# Patient Record
Sex: Male | Born: 1970 | Race: White | Hispanic: No | Marital: Single | Smoking: Current every day smoker
Health system: Southern US, Community
[De-identification: ages and names within clinical notes are randomized; demographics above are authoritative.]

## PROBLEM LIST (undated history)

## (undated) DIAGNOSIS — F419 Anxiety disorder, unspecified: Secondary | ICD-10-CM

## (undated) DIAGNOSIS — F32A Depression, unspecified: Secondary | ICD-10-CM

## (undated) DIAGNOSIS — I10 Essential (primary) hypertension: Secondary | ICD-10-CM

## (undated) DIAGNOSIS — F329 Major depressive disorder, single episode, unspecified: Secondary | ICD-10-CM

---

## 2012-05-14 ENCOUNTER — Encounter (HOSPITAL_COMMUNITY): Payer: Self-pay | Admitting: Emergency Medicine

## 2012-05-14 ENCOUNTER — Emergency Department (HOSPITAL_COMMUNITY)
Admission: EM | Admit: 2012-05-14 | Discharge: 2012-05-15 | Disposition: A | Discharge status: Home / Self Care | Payer: Self-pay | Attending: Emergency Medicine | Admitting: Emergency Medicine

## 2012-05-14 DIAGNOSIS — T07XXXA Unspecified multiple injuries, initial encounter: Secondary | ICD-10-CM | POA: Insufficient documentation

## 2012-05-14 DIAGNOSIS — Z23 Encounter for immunization: Secondary | ICD-10-CM | POA: Insufficient documentation

## 2012-05-14 DIAGNOSIS — S0292XA Unspecified fracture of facial bones, initial encounter for closed fracture: Secondary | ICD-10-CM

## 2012-05-14 DIAGNOSIS — S0280XA Fracture of other specified skull and facial bones, unspecified side, initial encounter for closed fracture: Secondary | ICD-10-CM | POA: Insufficient documentation

## 2012-05-14 DIAGNOSIS — F17200 Nicotine dependence, unspecified, uncomplicated: Secondary | ICD-10-CM | POA: Insufficient documentation

## 2012-05-14 HISTORY — DX: Essential (primary) hypertension: I10

## 2012-05-14 MED ORDER — IBUPROFEN 800 MG PO TABS
800.0000 mg | ORAL_TABLET | Freq: Once | ORAL | Status: AC
  Administered 2012-05-15: 800 mg via ORAL
  Filled 2012-05-14: qty 1
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
## 2012-05-15 ENCOUNTER — Emergency Department (HOSPITAL_COMMUNITY): Payer: Self-pay

## 2012-05-15 MED ORDER — IBUPROFEN 800 MG PO TABS: 800.0000 mg | ORAL_TABLET | Freq: Three times a day (TID) | ORAL | Status: DC

## 2012-05-15 MED ORDER — TETANUS-DIPHTH-ACELL PERTUSSIS 5-2.5-18.5 LF-MCG/0.5 IM SUSP
0.5000 mL | Freq: Once | INTRAMUSCULAR | Status: AC
  Administered 2012-05-15: 0.5 mL via INTRAMUSCULAR
  Filled 2012-05-15: qty 0.5

## 2012-05-15 MED ORDER — CEPHALEXIN 500 MG PO CAPS: 500.0000 mg | ORAL_CAPSULE | Freq: Four times a day (QID) | ORAL | Status: DC

## 2012-05-15 MED ORDER — HYDROCODONE-ACETAMINOPHEN 5-325 MG PO TABS: 2.0000 | ORAL_TABLET | ORAL | Status: DC | PRN

## 2012-05-15 NOTE — ED Notes (Signed)
Pt able to ambulate w/o assistance.   

## 2012-06-02 ENCOUNTER — Emergency Department (HOSPITAL_COMMUNITY)
Admission: EM | Admit: 2012-06-02 | Discharge: 2012-06-03 | Disposition: A | Discharge status: Home / Self Care | Payer: Self-pay | Attending: Emergency Medicine | Admitting: Emergency Medicine

## 2012-06-02 ENCOUNTER — Encounter (HOSPITAL_COMMUNITY): Payer: Self-pay | Admitting: *Deleted

## 2012-06-02 DIAGNOSIS — H5789 Other specified disorders of eye and adnexa: Secondary | ICD-10-CM | POA: Insufficient documentation

## 2012-06-02 DIAGNOSIS — I10 Essential (primary) hypertension: Secondary | ICD-10-CM | POA: Insufficient documentation

## 2012-06-02 DIAGNOSIS — Z8781 Personal history of (healed) traumatic fracture: Secondary | ICD-10-CM | POA: Insufficient documentation

## 2012-06-02 DIAGNOSIS — F17200 Nicotine dependence, unspecified, uncomplicated: Secondary | ICD-10-CM | POA: Insufficient documentation

## 2012-06-02 DIAGNOSIS — R220 Localized swelling, mass and lump, head: Secondary | ICD-10-CM | POA: Insufficient documentation

## 2012-06-02 NOTE — ED Notes (Signed)
Called for pt in [facility], no answer

## 2012-06-02 NOTE — ED Notes (Signed)
UXL:[id number]<NU> Expected date:<BR> Expected time:<BR> Means of arrival:<BR> Comments:<BR> Hold: Tr 3

## 2012-06-02 NOTE — ED Notes (Signed)
Unable to locate patient.

## 2012-06-02 NOTE — ED Notes (Signed)
Pt c/o altered vision, bleeding from nose, and blood in cornea which makes his eyesight worse. Pt continues to have ecchymosis and increased swelling to L eye in past few days.
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
## 2012-06-02 NOTE — ED Notes (Signed)
Pt c/o continued eye pain, headaches and was unable to follow up with specialist due to lack of insurance. Pt has taken no medication for pain, including OTC meds.

## 2012-06-02 NOTE — ED Notes (Signed)
Continue to be unable to find patient.
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
## 2012-06-09 NOTE — ED Provider Notes (Signed)
Medical screening examination/treatment/procedure(s) were performed by non-physician practitioner and as supervising physician I was immediately available for consultation/collaboration.  Ananda Caya M Devun Anna, MD 06/09/12 2309 

## 2015-07-20 ENCOUNTER — Emergency Department (HOSPITAL_COMMUNITY)
Admission: EM | Admit: 2015-07-20 | Discharge: 2015-07-21 | Disposition: A | Discharge status: Home / Self Care | Payer: Self-pay | Attending: Emergency Medicine | Admitting: Emergency Medicine

## 2015-07-20 ENCOUNTER — Emergency Department (HOSPITAL_COMMUNITY): Payer: Self-pay

## 2015-07-20 ENCOUNTER — Encounter (HOSPITAL_COMMUNITY): Payer: Self-pay | Admitting: Emergency Medicine

## 2015-07-20 DIAGNOSIS — Y998 Other external cause status: Secondary | ICD-10-CM | POA: Insufficient documentation

## 2015-07-20 DIAGNOSIS — Y9389 Activity, other specified: Secondary | ICD-10-CM | POA: Insufficient documentation

## 2015-07-20 DIAGNOSIS — S01321A Laceration with foreign body of right ear, initial encounter: Secondary | ICD-10-CM | POA: Insufficient documentation

## 2015-07-20 DIAGNOSIS — L988 Other specified disorders of the skin and subcutaneous tissue: Secondary | ICD-10-CM | POA: Insufficient documentation

## 2015-07-20 DIAGNOSIS — F17210 Nicotine dependence, cigarettes, uncomplicated: Secondary | ICD-10-CM | POA: Insufficient documentation

## 2015-07-20 DIAGNOSIS — I10 Essential (primary) hypertension: Secondary | ICD-10-CM | POA: Insufficient documentation

## 2015-07-20 DIAGNOSIS — Z590 Homelessness: Secondary | ICD-10-CM | POA: Insufficient documentation

## 2015-07-20 DIAGNOSIS — S01311A Laceration without foreign body of right ear, initial encounter: Secondary | ICD-10-CM

## 2015-07-20 DIAGNOSIS — Y92410 Unspecified street and highway as the place of occurrence of the external cause: Secondary | ICD-10-CM | POA: Insufficient documentation

## 2015-07-20 MED ORDER — LIDOCAINE-EPINEPHRINE (PF) 2 %-1:200000 IJ SOLN
10.0000 mL | Freq: Once | INTRAMUSCULAR | Status: AC
  Administered 2015-07-20: 10 mL

## 2015-07-20 MED ORDER — CLINDAMYCIN PHOSPHATE 900 MG/50ML IV SOLN
900.0000 mg | Freq: Once | INTRAVENOUS | Status: AC
  Administered 2015-07-20: 900 mg via INTRAVENOUS
  Filled 2015-07-20: qty 50

## 2015-07-20 NOTE — ED Notes (Signed)
ENT at bedside

## 2015-07-20 NOTE — ED Notes (Signed)
MD aware of patient ear.
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
## 2015-07-20 NOTE — ED Notes (Signed)
MD at bedside. 

## 2015-07-21 MED ORDER — CEPHALEXIN 500 MG PO CAPS: 500.0000 mg | ORAL_CAPSULE | Freq: Four times a day (QID) | ORAL | Status: DC

## 2015-07-21 MED ORDER — OXYCODONE-ACETAMINOPHEN 5-325 MG PO TABS: 1.0000 | ORAL_TABLET | ORAL | Status: DC | PRN

## 2015-07-21 NOTE — ED Notes (Signed)
Patient left at this time with all belongings. Provided homeless shelter resources and clean set of clothes
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
## 2016-02-03 ENCOUNTER — Emergency Department (HOSPITAL_COMMUNITY)
Admission: EM | Admit: 2016-02-03 | Discharge: 2016-02-04 | Disposition: A | Discharge status: Home / Self Care | Payer: Self-pay | Attending: Emergency Medicine | Admitting: Emergency Medicine

## 2016-02-03 ENCOUNTER — Encounter (HOSPITAL_COMMUNITY): Payer: Self-pay | Admitting: Emergency Medicine

## 2016-02-03 DIAGNOSIS — Z792 Long term (current) use of antibiotics: Secondary | ICD-10-CM | POA: Insufficient documentation

## 2016-02-03 DIAGNOSIS — L259 Unspecified contact dermatitis, unspecified cause: Secondary | ICD-10-CM | POA: Insufficient documentation

## 2016-02-03 DIAGNOSIS — I10 Essential (primary) hypertension: Secondary | ICD-10-CM | POA: Insufficient documentation

## 2016-02-03 DIAGNOSIS — F17210 Nicotine dependence, cigarettes, uncomplicated: Secondary | ICD-10-CM | POA: Insufficient documentation

## 2016-02-03 DIAGNOSIS — Z791 Long term (current) use of non-steroidal anti-inflammatories (NSAID): Secondary | ICD-10-CM | POA: Insufficient documentation

## 2016-02-03 DIAGNOSIS — R21 Rash and other nonspecific skin eruption: Secondary | ICD-10-CM

## 2016-02-03 MED ORDER — PENICILLIN G BENZATHINE 1200000 UNIT/2ML IM SUSP
2.4000 10*6.[IU] | Freq: Once | INTRAMUSCULAR | Status: AC
  Administered 2016-02-04: 2.4 10*6.[IU] via INTRAMUSCULAR
  Filled 2016-02-03 (×2): qty 4

## 2016-02-03 MED ORDER — AZITHROMYCIN 250 MG PO TABS
1000.0000 mg | ORAL_TABLET | Freq: Once | ORAL | Status: AC
  Administered 2016-02-04: 1000 mg via ORAL
  Filled 2016-02-03: qty 4

## 2016-02-03 MED ORDER — STERILE WATER FOR INJECTION IJ SOLN
INTRAMUSCULAR | Status: AC
  Administered 2016-02-04: 0.9 mL
  Filled 2016-02-03: qty 10

## 2016-02-03 MED ORDER — CEFTRIAXONE SODIUM 250 MG IJ SOLR
250.0000 mg | Freq: Once | INTRAMUSCULAR | Status: AC
  Administered 2016-02-04: 250 mg via INTRAMUSCULAR
  Filled 2016-02-03: qty 250

## 2016-02-03 MED ORDER — PENICILLIN G BENZATHINE & PROC 1200000 UNIT/2ML IM SUSP: 2.4000 10*6.[IU] | Freq: Once | INTRAMUSCULAR | Status: DC

## 2016-02-03 NOTE — ED Notes (Signed)
Bed: [facility] Expected date:  Expected time:  Means of arrival:  Comments: 
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
## 2016-02-04 LAB — RPR: RPR: NONREACTIVE

## 2016-02-04 LAB — HIV ANTIBODY (ROUTINE TESTING W REFLEX): HIV Screen 4th Generation wRfx: NONREACTIVE

## 2016-02-04 LAB — GC/CHLAMYDIA PROBE AMP (~~LOC~~) NOT AT ARMC
CHLAMYDIA, DNA PROBE: NEGATIVE
Neisseria Gonorrhea: NEGATIVE

## 2016-02-04 MED ORDER — DESONIDE 0.05 % EX LOTN: TOPICAL_LOTION | Freq: Two times a day (BID) | CUTANEOUS | 0 refills | Status: DC

## 2016-02-04 NOTE — ED Provider Notes (Signed)
[facility]-EMERGENCY DEPT Provider Note   CSN: [id number] Arrival date & time: 02/03/16  2327     History   Chief Complaint Chief Complaint  Patient presents with  . Rash    HPI Ralph Anderson is a 45 y.o. male.  45 yo M with rash to groin.  Going on for at least two months.  He thinks its related to heat rash. Some mild itching and erythema, has been using talcum powder and steroid cream with some relief.  Denies fevers, chills, plant exposure.    The history is provided by the patient.  Rash   This is a new problem. The current episode started more than 1 week ago. The problem has been gradually worsening. The problem is associated with nothing. There has been no fever. The fever has been present for less than 1 day. The pain is at a severity of 2/10. The pain is mild. The pain has been constant since onset. He has tried steriods for the symptoms. The treatment provided mild relief.    Past Medical History:  Diagnosis Date  . Hypertension     There are no active problems to display for this patient.   History reviewed. No pertinent surgical history.     Home Medications    Prior to Admission medications   Medication Sig Start Date End Date Taking? Authorizing Provider  cephALEXin (KEFLEX) 500 MG capsule Take 1 capsule (500 mg total) by mouth 4 (four) times daily. 07/21/15   Dione Boozeavid Glick, MD  desonide (DESOWEN) 0.05 % lotion Apply topically 2 (two) times daily. 02/04/16   Melene Planan Korra Christine, DO  oxyCODONE-acetaminophen (PERCOCET) 5-325 MG tablet Take 1 tablet by mouth every 4 (four) hours as needed for moderate pain. 07/21/15   Dione Boozeavid Glick, MD    Family History No family history on file.  Social History Social History  Substance Use Topics  . Smoking status: Current Every Day Smoker    Packs/day: 1.00    Types: Cigarettes  . Smokeless tobacco: Never Used  . Alcohol use 25.2 oz/week    42 Cans of beer per week     Allergies   Review of patient's allergies indicates no known  allergies.   Review of Systems Review of Systems  Constitutional: Negative for chills and fever.  HENT: Negative for congestion and facial swelling.   Eyes: Negative for discharge and visual disturbance.  Respiratory: Negative for shortness of breath.   Cardiovascular: Negative for chest pain and palpitations.  Gastrointestinal: Negative for abdominal pain, diarrhea and vomiting.  Musculoskeletal: Negative for arthralgias and myalgias.  Skin: Positive for color change and rash.  Neurological: Negative for tremors, syncope and headaches.  Psychiatric/Behavioral: Negative for confusion and dysphoric mood.     Physical Exam Updated Vital Signs BP 126/78 (BP Location: Left Wrist)   Pulse 92   Temp 97.9 F (36.6 C) (Oral)   SpO2 92%   Physical Exam  Constitutional: He is oriented to person, place, and time. He appears well-developed and well-nourished.  HENT:  Head: Normocephalic and atraumatic.  Eyes: EOM are normal. Pupils are equal, round, and reactive to light.  Neck: Normal range of motion. Neck supple. No JVD present.  Cardiovascular: Normal rate and regular rhythm.  Exam reveals no gallop and no friction rub.   No murmur heard. Pulmonary/Chest: No respiratory distress. He has no wheezes.  Abdominal: He exhibits no distension. There is no rebound and no guarding.    Genitourinary:     Musculoskeletal: Normal range of motion.  Neurological: He is alert and oriented to person, place, and time.  Skin: No rash noted. No pallor.  Psychiatric: He has a normal mood and affect. His behavior is normal.  Nursing note and vitals reviewed.    ED Treatments / Results  Labs (all labs ordered are listed, but only abnormal results are displayed) Labs Reviewed  RPR  HIV ANTIBODY (ROUTINE TESTING)  GC/CHLAMYDIA PROBE AMP ([place]) NOT AT [facility]    EKG  EKG Interpretation None       Radiology No results found.  Procedures Procedures (including critical care  time)  Medications Ordered in ED Medications  cefTRIAXone (ROCEPHIN) injection 250 mg (250 mg Intramuscular Given 02/04/16 0001)  azithromycin (ZITHROMAX) tablet 1,000 mg (1,000 mg Oral Given 02/04/16 0001)  penicillin g benzathine (BICILLIN LA) 1200000 UNIT/2ML injection 2.4 Million Units (2.4 Million Units Intramuscular Given 02/04/16 0001)  sterile water (preservative free) injection (0.9 mLs  Given 02/04/16 0010)     Initial Impression / Assessment and Plan / ED Course  I have reviewed the triage vital signs and the nursing notes.  Pertinent labs & imaging results that were available during my care of the patient were reviewed by me and considered in my medical decision making (see chart for details).  Clinical Course    45 yo M with A rash to the groin. This extends onto his penis. Is been going on for the past 2 months. I will treat presumptively for STDs and syphilis. Patient's largest area appears to coincide with where his belt buckle since. This may be a contact dermatitis from nickel. Suggested that he try and replace the belt buckle. Given prescription for steroid cream.  12:31 AM:  I have discussed the diagnosis/risks/treatment options with the patient and believe the pt to be eligible for discharge home to follow-up with PCP. We also discussed returning to the ED immediately if new or worsening sx occur. We discussed the sx which are most concerning (e.g., sudden worsening pain, fever, inability to tolerate by mouth) that necessitate immediate return. Medications administered to the patient during their visit and any new prescriptions provided to the patient are listed below.  Medications given during this visit Medications  cefTRIAXone (ROCEPHIN) injection 250 mg (250 mg Intramuscular Given 02/04/16 0001)  azithromycin (ZITHROMAX) tablet 1,000 mg (1,000 mg Oral Given 02/04/16 0001)  penicillin g benzathine (BICILLIN LA) 1200000 UNIT/2ML injection 2.4 Million Units (2.4 Million  Units Intramuscular Given 02/04/16 0001)  sterile water (preservative free) injection (0.9 mLs  Given 02/04/16 0010)     The patient appears reasonably screen and/or stabilized for discharge and I doubt any other medical condition or other [facility] requiring further screening, evaluation, or treatment in the ED at this time prior to discharge.    Final Clinical Impressions(s) / ED Diagnoses   Final diagnoses:  Contact dermatitis  Rash and nonspecific skin eruption    New Prescriptions New Prescriptions   DESONIDE (DESOWEN) 0.05 % LOTION    Apply topically 2 (two) times daily.     Melene Plan, DO 02/04/16 0031

## 2016-04-07 ENCOUNTER — Emergency Department (HOSPITAL_COMMUNITY)
Admission: EM | Admit: 2016-04-07 | Discharge: 2016-04-08 | Disposition: A | Discharge status: Home / Self Care | Payer: Federal, State, Local not specified - Other | Attending: Emergency Medicine | Admitting: Emergency Medicine

## 2016-04-07 DIAGNOSIS — F17210 Nicotine dependence, cigarettes, uncomplicated: Secondary | ICD-10-CM | POA: Insufficient documentation

## 2016-04-07 DIAGNOSIS — Z79899 Other long term (current) drug therapy: Secondary | ICD-10-CM | POA: Insufficient documentation

## 2016-04-07 DIAGNOSIS — I10 Essential (primary) hypertension: Secondary | ICD-10-CM | POA: Insufficient documentation

## 2016-04-07 DIAGNOSIS — F32A Depression, unspecified: Secondary | ICD-10-CM

## 2016-04-07 DIAGNOSIS — F1014 Alcohol abuse with alcohol-induced mood disorder: Secondary | ICD-10-CM | POA: Insufficient documentation

## 2016-04-07 DIAGNOSIS — F329 Major depressive disorder, single episode, unspecified: Secondary | ICD-10-CM | POA: Insufficient documentation

## 2016-04-07 DIAGNOSIS — R45851 Suicidal ideations: Secondary | ICD-10-CM

## 2016-04-08 ENCOUNTER — Encounter (HOSPITAL_COMMUNITY): Payer: Self-pay

## 2016-04-08 DIAGNOSIS — F17210 Nicotine dependence, cigarettes, uncomplicated: Secondary | ICD-10-CM | POA: Diagnosis not present

## 2016-04-08 DIAGNOSIS — F1014 Alcohol abuse with alcohol-induced mood disorder: Secondary | ICD-10-CM | POA: Diagnosis present

## 2016-04-08 DIAGNOSIS — Z79899 Other long term (current) drug therapy: Secondary | ICD-10-CM

## 2016-04-08 DIAGNOSIS — Z79891 Long term (current) use of opiate analgesic: Secondary | ICD-10-CM

## 2016-04-08 LAB — COMPREHENSIVE METABOLIC PANEL
ALT: 25 U/L (ref 17–63)
ANION GAP: 7 (ref 5–15)
AST: 31 U/L (ref 15–41)
Albumin: 3.8 g/dL (ref 3.5–5.0)
Alkaline Phosphatase: 80 U/L (ref 38–126)
BUN: 15 mg/dL (ref 6–20)
CHLORIDE: 106 mmol/L (ref 101–111)
CO2: 25 mmol/L (ref 22–32)
Calcium: 8.8 mg/dL — ABNORMAL LOW (ref 8.9–10.3)
Creatinine, Ser: 0.68 mg/dL (ref 0.61–1.24)
Glucose, Bld: 108 mg/dL — ABNORMAL HIGH (ref 65–99)
POTASSIUM: 4.5 mmol/L (ref 3.5–5.1)
SODIUM: 138 mmol/L (ref 135–145)
Total Bilirubin: 1.4 mg/dL — ABNORMAL HIGH (ref 0.3–1.2)
Total Protein: 6 g/dL — ABNORMAL LOW (ref 6.5–8.1)

## 2016-04-08 LAB — RAPID URINE DRUG SCREEN, HOSP PERFORMED
AMPHETAMINES: NOT DETECTED
Barbiturates: NOT DETECTED
Benzodiazepines: NOT DETECTED
Cocaine: NOT DETECTED
OPIATES: NOT DETECTED
Tetrahydrocannabinol: NOT DETECTED

## 2016-04-08 LAB — CBC
HCT: 42.3 % (ref 39.0–52.0)
Hemoglobin: 14.7 g/dL (ref 13.0–17.0)
MCH: 30.3 pg (ref 26.0–34.0)
MCHC: 34.8 g/dL (ref 30.0–36.0)
MCV: 87.2 fL (ref 78.0–100.0)
Platelets: 184 10*3/uL (ref 150–400)
RBC: 4.85 MIL/uL (ref 4.22–5.81)
RDW: 13.8 % (ref 11.5–15.5)
WBC: 8.5 10*3/uL (ref 4.0–10.5)

## 2016-04-08 LAB — ACETAMINOPHEN LEVEL

## 2016-04-08 LAB — ETHANOL

## 2016-04-08 LAB — SALICYLATE LEVEL

## 2016-04-08 NOTE — ED Notes (Signed)
Pt belongings placed in locker 29 

## 2016-04-08 NOTE — Progress Notes (Signed)
CSW spoke with patient at bedside. CSW informed patient that psychiatrist recommends that patient follow up with [facility] to receive outpatient treatment. CSW provided patient with [facility] handout that included services provided and contact information. CSW encouraged patient to follow up with [facility]. CSW inquired if patient had any questions, patient replied no.

## 2016-04-08 NOTE — ED Notes (Signed)
Bed: WA32 Expected date:  Expected time:  Means of arrival:  Comments: 

## 2016-04-08 NOTE — ED Notes (Addendum)
Pt is refuses blood work, Charity fundraiserN made aware

## 2016-04-08 NOTE — ED Notes (Signed)
Pt refusing labs states unbearable fear of needles

## 2016-04-08 NOTE — ED Notes (Signed)
Pt states that he is willing to have labwork drawn now, notified phlebotomist to draw when available.

## 2016-04-08 NOTE — Consult Note (Signed)
[facility] Consult   Reason for Consult:  Alcohol abuse with suicidal ideations Referring Physician:  EDP Patient Identification: Ralph Anderson MRN:  [id number] Principal Diagnosis: Alcohol abuse with alcohol-induced mood disorder [facility]) Diagnosis:   Patient Active Problem List   Diagnosis Date Noted  . Alcohol abuse with alcohol-induced mood disorder ([place]) [F10.14] 04/08/2016    Priority: High    Total Time spent with patient: 45 minutes  Subjective:   Ralph Anderson is a 45 y.o. male patient does not warrant admission.  HPI:  45 yo male who presented to the ED with alcohol abuse and suicidal ideations.  He is sober today with clear and coherent thought process.  Denies suicidal/homicidal ideations along with past attempts.  No hallucinations or withdrawal symptoms.  Minimizes his alcohol use but has a DWI court date in December.  Stable for discharge.  Past Psychiatric History: none  Risk to Self: Suicidal Ideation: Yes-Currently Present Suicidal Intent: Yes-Currently Present Is patient at risk for suicide?: Yes Suicidal Plan?: No Access to Means: No What has been your use of drugs/alcohol within the last 12 months?: na How many times?: 0 Other Self Harm Risks: 0 Triggers for Past Attempts: Unknown Intentional Self Injurious Behavior: None Risk to Others: Homicidal Ideation: No Thoughts of Harm to Others: No Current Homicidal Intent: No Current Homicidal Plan: No Access to Homicidal Means: No Identified Victim: na History of harm to others?: No Assessment of Violence: None Noted Violent Behavior Description: na Does patient have access to weapons?: No Criminal Charges Pending?: Yes Describe Pending Criminal Charges: 2 DWI Does patient have a court date: Yes Court Date: 04/28/16 Prior Inpatient Therapy: Prior Inpatient Therapy: No Prior Therapy Dates: na Prior Therapy Facilty/Provider(s): na Reason for Treatment: na Prior Outpatient Therapy: Prior  Outpatient Therapy: No Prior Therapy Facilty/Provider(s): na Reason for Treatment: na Does patient have an ACCT team?: No Does patient have Intensive In-House Services?  : No Does patient have [facility] services? : No Does patient have P4[facility] services?: No  Past Medical History:  Past Medical History:  Diagnosis Date  . Hypertension    No past surgical history on file. Family History: No family history on file. Family Psychiatric  History: none Social History:  History  Alcohol Use  . 25.2 oz/week  . 43 Cans of beer per week     History  Drug Use No    Social History   Social History  . Marital status: Single    Spouse name: N/A  . Number of children: N/A  . Years of education: N/A   Social History Main Topics  . Smoking status: Current Every Day Smoker    Packs/day: 1.00    Types: Cigarettes  . Smokeless tobacco: Never Used  . Alcohol use 25.2 oz/week    42 Cans of beer per week  . Drug use: No  . Sexual activity: Yes   Other Topics Concern  . None   Social History Narrative  . None   Additional Social History:    Allergies:  No Known Allergies  Labs:  Results for orders placed or performed during the hospital encounter of 04/07/16 (from the past 48 hour(s))  Rapid urine drug screen (hospital performed)     Status: None   Collection Time: 04/08/16 12:15 AM  Result Value Ref Range   Opiates NONE DETECTED NONE DETECTED   Cocaine NONE DETECTED NONE DETECTED   Benzodiazepines NONE DETECTED NONE DETECTED   Amphetamines NONE DETECTED NONE DETECTED   Tetrahydrocannabinol  NONE DETECTED NONE DETECTED   Barbiturates NONE DETECTED NONE DETECTED    Comment:        DRUG SCREEN FOR MEDICAL PURPOSES ONLY.  IF CONFIRMATION IS NEEDED FOR ANY PURPOSE, NOTIFY LAB WITHIN 5 DAYS.        LOWEST DETECTABLE LIMITS FOR URINE DRUG SCREEN Drug Class       Cutoff (ng/mL) Amphetamine      1000 Barbiturate      200 Benzodiazepine   812 Tricyclics       751 Opiates           300 Cocaine          300 THC              50   Comprehensive metabolic panel     Status: Abnormal   Collection Time: 04/08/16  8:10 AM  Result Value Ref Range   Sodium 138 135 - 145 mmol/L   Potassium 4.5 3.5 - 5.1 mmol/L   Chloride 106 101 - 111 mmol/L   CO2 25 22 - 32 mmol/L   Glucose, Bld 108 (H) 65 - 99 mg/dL   BUN 15 6 - 20 mg/dL   Creatinine, Ser 0.68 0.61 - 1.24 mg/dL   Calcium 8.8 (L) 8.9 - 10.3 mg/dL   Total Protein 6.0 (L) 6.5 - 8.1 g/dL   Albumin 3.8 3.5 - 5.0 g/dL   AST 31 15 - 41 U/L   ALT 25 17 - 63 U/L   Alkaline Phosphatase 80 38 - 126 U/L   Total Bilirubin 1.4 (H) 0.3 - 1.2 mg/dL   GFR calc non Af Amer >60 >60 mL/min   GFR calc Af Amer >60 >60 mL/min    Comment: (NOTE) The eGFR has been calculated using the CKD EPI equation. This calculation has not been validated in all clinical situations. eGFR's persistently <60 mL/min signify possible Chronic Kidney Disease.    Anion gap 7 5 - 15  Ethanol     Status: None   Collection Time: 04/08/16  8:10 AM  Result Value Ref Range   Alcohol, Ethyl (B) <5 <5 mg/dL    Comment:        LOWEST DETECTABLE LIMIT FOR SERUM ALCOHOL IS 5 mg/dL FOR MEDICAL PURPOSES ONLY   Salicylate level     Status: None   Collection Time: 04/08/16  8:10 AM  Result Value Ref Range   Salicylate Lvl <7.0 2.8 - 30.0 mg/dL  Acetaminophen level     Status: Abnormal   Collection Time: 04/08/16  8:10 AM  Result Value Ref Range   Acetaminophen (Tylenol), Serum <10 (L) 10 - 30 ug/mL    Comment:        THERAPEUTIC CONCENTRATIONS VARY SIGNIFICANTLY. A RANGE OF 10-30 ug/mL MAY BE AN EFFECTIVE CONCENTRATION FOR MANY PATIENTS. HOWEVER, SOME ARE BEST TREATED AT CONCENTRATIONS OUTSIDE THIS RANGE. ACETAMINOPHEN CONCENTRATIONS >150 ug/mL AT 4 HOURS AFTER INGESTION AND >50 ug/mL AT 12 HOURS AFTER INGESTION ARE OFTEN ASSOCIATED WITH TOXIC REACTIONS.   cbc     Status: None   Collection Time: 04/08/16  8:10 AM  Result Value Ref Range   WBC 8.5  4.0 - 10.5 K/uL   RBC 4.85 4.22 - 5.81 MIL/uL   Hemoglobin 14.7 13.0 - 17.0 g/dL   HCT 42.3 39.0 - 52.0 %   MCV 87.2 78.0 - 100.0 fL   MCH 30.3 26.0 - 34.0 pg   MCHC 34.8 30.0 - 36.0 g/dL   RDW 13.8 11.5 -  15.5 %   Platelets 184 150 - 400 K/uL    No current facility-administered medications for this encounter.    Current Outpatient Prescriptions  Medication Sig Dispense Refill  . cephALEXin (KEFLEX) 500 MG capsule Take 1 capsule (500 mg total) by mouth 4 (four) times daily. (Patient not taking: Reported on 04/08/2016) 40 capsule 0  . desonide (DESOWEN) 0.05 % lotion Apply topically 2 (two) times daily. (Patient not taking: Reported on 04/08/2016) 59 mL 0  . oxyCODONE-acetaminophen (PERCOCET) 5-325 MG tablet Take 1 tablet by mouth every 4 (four) hours as needed for moderate pain. (Patient not taking: Reported on 04/08/2016) 15 tablet 0    Musculoskeletal: Strength & Muscle Tone: within normal limits Gait & Station: normal Patient leans: N/A  Psychiatric Specialty Exam: Physical Exam  Constitutional: He is oriented to person, place, and time. He appears well-developed and well-nourished.  HENT:  Head: Normocephalic.  Neck: Normal range of motion.  Respiratory: Effort normal.  Musculoskeletal: Normal range of motion.  Neurological: He is alert and oriented to person, place, and time.  Psychiatric: His speech is normal and behavior is normal. Judgment and thought content normal. Cognition and memory are normal. He exhibits a depressed mood.    Review of Systems  Constitutional: Negative.   HENT: Negative.   Eyes: Negative.   Respiratory: Negative.   Cardiovascular: Negative.   Gastrointestinal: Negative.   Genitourinary: Negative.   Musculoskeletal: Negative.   Skin: Negative.   Neurological: Negative.   Endo/Heme/Allergies: Negative.   Psychiatric/Behavioral: Positive for depression and substance abuse.    Blood pressure 133/83, pulse 94, temperature 98.1 F (36.7 C),  temperature source Oral, resp. rate 20, height 5' 7"  (1.702 m), weight 81.6 kg (180 lb), SpO2 95 %.Body mass index is 28.19 kg/m.  General Appearance: Casual  Eye Contact:  Good  Speech:  Normal Rate  Volume:  Normal  Mood:  Depressed, mild  Affect:  Congruent  Thought Process:  Coherent and Descriptions of Associations: Intact  Orientation:  Full (Time, Place, and Person)  Thought Content:  WDL  Suicidal Thoughts:  No  Homicidal Thoughts:  No  Memory:  Immediate;   Good Recent;   Good Remote;   Good  Judgement:  Fair  Insight:  Fair  Psychomotor Activity:  Normal  Concentration:  Concentration: Good and Attention Span: Good  Recall:  Good  Fund of Knowledge:  Good  Language:  Good  Akathisia:  No  Handed:  Right  AIMS (if indicated):     Assets:  Housing Intimacy Leisure Time Physical Health Resilience Social Support  ADL's:  Intact  Cognition:  WNL  Sleep:        Treatment Plan Summary: Daily contact with patient to assess and evaluate symptoms and progress in treatment, Medication management and Plan alcohol abuse with alcohol induced mood disorder:  -Crisis stabilization -Medication management:  No medications started as he is discharging -Individual and substance abuse counseling -Outpatient resources provided  Disposition: No evidence of imminent risk to self or others at present.    Waylan Boga, NP 04/08/2016 11:03 AM   Patient seen face to face for this evaluation, case discussed with treatment team and physician extender and formulated treatment plan. Reviewed the information documented and agree with the treatment plan.  Sterlin Knightly 04/09/2016 10:15 AM

## 2016-04-08 NOTE — [facility] Suicide Risk Assessment (Signed)
Suicide Risk Assessment  Discharge Assessment   [facility]   Principal Problem: Alcohol abuse with alcohol-induced mood disorder [facility]) Discharge Diagnoses:  Patient Active Problem List   Diagnosis Date Noted  . Alcohol abuse with alcohol-induced mood disorder ([facility]) [F10.14] 04/08/2016    Priority: High    Total Time spent with patient: 45 minutes  Musculoskeletal: Strength & Muscle Tone: within normal limits Gait & Station: normal Patient leans: N/A  Psychiatric Specialty Exam: Physical Exam  Constitutional: He is oriented to person, place, and time. He appears well-developed and well-nourished.  HENT:  Head: Normocephalic.  Neck: Normal range of motion.  Respiratory: Effort normal.  Musculoskeletal: Normal range of motion.  Neurological: He is alert and oriented to person, place, and time.  Psychiatric: His speech is normal and behavior is normal. Judgment and thought content normal. Cognition and memory are normal. He exhibits a depressed mood.    Review of Systems  Constitutional: Negative.   HENT: Negative.   Eyes: Negative.   Respiratory: Negative.   Cardiovascular: Negative.   Gastrointestinal: Negative.   Genitourinary: Negative.   Musculoskeletal: Negative.   Skin: Negative.   Neurological: Negative.   Endo/Heme/Allergies: Negative.   Psychiatric/Behavioral: Positive for depression and substance abuse.    Blood pressure 133/83, pulse 94, temperature 98.1 F (36.7 C), temperature source Oral, resp. rate 20, height 5\' 7"  (1.702 m), weight 81.6 kg (180 lb), SpO2 95 %.Body mass index is 28.19 kg/m.  General Appearance: Casual  Eye Contact:  Good  Speech:  Normal Rate  Volume:  Normal  Mood:  Depressed, mild  Affect:  Congruent  Thought Process:  Coherent and Descriptions of Associations: Intact  Orientation:  Full (Time, Place, and Person)  Thought Content:  WDL  Suicidal Thoughts:  No  Homicidal Thoughts:  No  Memory:   Immediate;   Good Recent;   Good Remote;   Good  Judgement:  Fair  Insight:  Fair  Psychomotor Activity:  Normal  Concentration:  Concentration: Good and Attention Span: Good  Recall:  Good  Fund of Knowledge:  Good  Language:  Good  Akathisia:  No  Handed:  Right  AIMS (if indicated):     Assets:  Housing Intimacy Leisure Time Physical Health Resilience Social Support  ADL's:  Intact  Cognition:  WNL  Sleep:      Mental Status Per Nursing Assessment::   On Admission:   alcohol abuse with suicidal ideations  Demographic Factors:  Male and Caucasian  Loss Factors: Legal issues  Historical Factors: NA  Risk Reduction Factors:   Sense of responsibility to family, Living with another person, especially a relative and Positive social support  Continued Clinical Symptoms:  Depression,mild  Cognitive Features That Contribute To Risk:  None    Suicide Risk:  Minimal: No identifiable suicidal ideation.  Patients presenting with no risk factors but with morbid ruminations; may be classified as minimal risk based on the severity of the depressive symptoms    Plan Of Care/Follow-up recommendations:  Activity:  as tolerated Diet:  heart healthy diet  LORD, JAMISON, NP 04/08/2016, 11:15 AM

## 2016-04-08 NOTE — ED Triage Notes (Addendum)
Pt states" I am suicidal, and have been thinking about jumping in front of a car". Pt reports that he has been having this feeling for the past 7 days but has worsened today, especially with the holiday. Pt denies HI. Pt states that he has not felt this way before and" wants to talk with someone to get his head straight". Pt brought his self into the ED. Pt reports that he has not tried to harm himself. Denies illicit drug use. Pt reports that he has had (1)  24oz beer today and has not drunk alcohol in approx. 7 days.

## 2016-04-08 NOTE — ED Notes (Signed)
Pt spoke with his Nephew Evert KohlJason Kathan Cell phone # [phone]
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
## 2016-04-08 NOTE — ED Notes (Signed)
Provided patient with toiletries, towels and wash clothes for shower, patient took shower and changed into clean paper scrubs

## 2016-04-08 NOTE — ED Notes (Signed)
Patient is resting comfortably. 

## 2016-04-08 NOTE — ED Provider Notes (Signed)
[facility]-EMERGENCY DEPT Provider Note   CSN: [id number] Arrival date & time: 04/07/16  2315     History   Chief Complaint Chief Complaint  Patient presents with  . Suicidal    HPI Ralph Anderson is a 45 y.o. male.  Patient presents to the emergency department for evaluation of suicidal thoughts. He has recently become homeless and today had an argument with his girlfriend and felt more depressed and reports it was so bad he thought seriously of harming himself. No substance abuse issues. No self harm prior to arrival. No HI/AVH.    The history is provided by the patient. No language interpreter was used.    Past Medical History:  Diagnosis Date  . Hypertension     There are no active problems to display for this patient.   No past surgical history on file.     Home Medications    Prior to Admission medications   Medication Sig Start Date End Date Taking? Authorizing Provider  cephALEXin (KEFLEX) 500 MG capsule Take 1 capsule (500 mg total) by mouth 4 (four) times daily. Patient not taking: Reported on 04/08/2016 07/21/15   Dione Boozeavid Glick, MD  desonide (DESOWEN) 0.05 % lotion Apply topically 2 (two) times daily. Patient not taking: Reported on 04/08/2016 02/04/16   Melene Planan Floyd, DO  oxyCODONE-acetaminophen (PERCOCET) 5-325 MG tablet Take 1 tablet by mouth every 4 (four) hours as needed for moderate pain. Patient not taking: Reported on 04/08/2016 07/21/15   Dione Boozeavid Glick, MD    Family History No family history on file.  Social History Social History  Substance Use Topics  . Smoking status: Current Every Day Smoker    Packs/day: 1.00    Types: Cigarettes  . Smokeless tobacco: Never Used  . Alcohol use 25.2 oz/week    42 Cans of beer per week     Allergies   Patient has no known allergies.   Review of Systems Review of Systems  Constitutional: Negative for chills and fever.  HENT: Negative.   Respiratory: Negative.   Cardiovascular: Negative.   Gastrointestinal:  Negative.   Musculoskeletal: Negative.   Skin: Negative.   Neurological: Negative.   Psychiatric/Behavioral: Positive for dysphoric mood and suicidal ideas. Negative for self-injury.     Physical Exam Updated Vital Signs BP 124/81 (BP Location: Left Arm)   Pulse 87   Temp 97.6 F (36.4 C) (Oral)   Resp 20   Ht 5\' 7"  (1.702 m)   Wt 81.6 kg   SpO2 100%   BMI 28.19 kg/m   Physical Exam  Constitutional: He is oriented to person, place, and time. He appears well-developed and well-nourished. No distress.  HENT:  Head: Normocephalic.  Neck: Normal range of motion. Neck supple.  Cardiovascular: Normal rate and regular rhythm.   Pulmonary/Chest: Effort normal and breath sounds normal. He has no wheezes. He has no rales.  Abdominal: Soft. Bowel sounds are normal. There is no tenderness. There is no rebound and no guarding.  Musculoskeletal: Normal range of motion.  Neurological: He is alert and oriented to person, place, and time.  Skin: Skin is warm and dry. No rash noted.  Psychiatric: He has a normal mood and affect.     ED Treatments / Results  Labs (all labs ordered are listed, but only abnormal results are displayed) Labs Reviewed  RAPID URINE DRUG SCREEN, HOSP PERFORMED  COMPREHENSIVE METABOLIC PANEL  ETHANOL  SALICYLATE LEVEL  ACETAMINOPHEN LEVEL  CBC   Results for orders placed or performed during  the hospital encounter of 04/07/16  Rapid urine drug screen (hospital performed)  Result Value Ref Range   Opiates NONE DETECTED NONE DETECTED   Cocaine NONE DETECTED NONE DETECTED   Benzodiazepines NONE DETECTED NONE DETECTED   Amphetamines NONE DETECTED NONE DETECTED   Tetrahydrocannabinol NONE DETECTED NONE DETECTED   Barbiturates NONE DETECTED NONE DETECTED    EKG  EKG Interpretation  Date/Time:  Saturday April 08 2016 00:47:09 EST Ventricular Rate:  83 PR Interval:  120 QRS Duration: 114 QT Interval:  386 QTC Calculation: 453 R Axis:   -15 Text  Interpretation:  Normal sinus rhythm Incomplete right bundle branch block Left ventricular hypertrophy Abnormal ECG No old tracing to compare Confirmed by [facility] HospitalGLICK  MD, DAVID ([id number]) on 04/08/2016 12:59:21 AM       Radiology No results found.  Procedures Procedures (including critical care time)  Medications Ordered in ED Medications - No data to display   Initial Impression / Assessment and Plan / ED Course  I have reviewed the triage vital signs and the nursing notes.  Pertinent labs & imaging results that were available during my care of the patient were reviewed by me and considered in my medical decision making (see chart for details).  Clinical Course     Patient with depression and SI. Will have TTS consultation to determine disposition.   Final Clinical Impressions(s) / ED Diagnoses   Final diagnoses:  None   1. SI 2. Depression  New Prescriptions New Prescriptions   No medications on file     Danne HarborShari Arshdeep Bolger, PA-C 04/08/16 0236    Dione Boozeavid Glick, MD 04/08/16 [phone]

## 2016-04-24 ENCOUNTER — Encounter (HOSPITAL_COMMUNITY): Payer: Self-pay | Admitting: *Deleted

## 2016-04-24 ENCOUNTER — Emergency Department (HOSPITAL_COMMUNITY)
Admission: EM | Admit: 2016-04-24 | Discharge: 2016-04-25 | Disposition: A | Discharge status: Other Acute Inpatient Hospital | Payer: Federal, State, Local not specified - Other

## 2016-04-24 DIAGNOSIS — F1014 Alcohol abuse with alcohol-induced mood disorder: Secondary | ICD-10-CM | POA: Insufficient documentation

## 2016-04-24 DIAGNOSIS — F332 Major depressive disorder, recurrent severe without psychotic features: Secondary | ICD-10-CM | POA: Diagnosis present

## 2016-04-24 DIAGNOSIS — F1094 Alcohol use, unspecified with alcohol-induced mood disorder: Secondary | ICD-10-CM

## 2016-04-24 DIAGNOSIS — I10 Essential (primary) hypertension: Secondary | ICD-10-CM | POA: Insufficient documentation

## 2016-04-24 DIAGNOSIS — Z5181 Encounter for therapeutic drug level monitoring: Secondary | ICD-10-CM | POA: Insufficient documentation

## 2016-04-24 DIAGNOSIS — F17210 Nicotine dependence, cigarettes, uncomplicated: Secondary | ICD-10-CM | POA: Insufficient documentation

## 2016-04-24 LAB — COMPREHENSIVE METABOLIC PANEL
ALK PHOS: 86 U/L (ref 38–126)
ALT: 23 U/L (ref 17–63)
ANION GAP: 7 (ref 5–15)
AST: 26 U/L (ref 15–41)
Albumin: 4.7 g/dL (ref 3.5–5.0)
BILIRUBIN TOTAL: 1.1 mg/dL (ref 0.3–1.2)
BUN: 11 mg/dL (ref 6–20)
CO2: 27 mmol/L (ref 22–32)
Calcium: 9.1 mg/dL (ref 8.9–10.3)
Chloride: 106 mmol/L (ref 101–111)
Creatinine, Ser: 0.67 mg/dL (ref 0.61–1.24)
GFR calc Af Amer: >60 mL/min (ref >60)
Glucose, Bld: 108 mg/dL — ABNORMAL HIGH (ref 65–99)
POTASSIUM: 4 mmol/L (ref 3.5–5.1)
Sodium: 140 mmol/L (ref 135–145)
Total Protein: 7.5 g/dL (ref 6.5–8.1)

## 2016-04-24 LAB — CBC WITH DIFFERENTIAL/PLATELET
Basophils Absolute: 0 10*3/uL (ref 0.0–0.1)
Basophils Relative: 0 %
Eosinophils Absolute: 0.2 10*3/uL (ref 0.0–0.7)
Eosinophils Relative: 2 %
HEMATOCRIT: 45.8 % (ref 39.0–52.0)
Hemoglobin: 15.8 g/dL (ref 13.0–17.0)
LYMPHS PCT: 37 %
Lymphs Abs: 3.3 10*3/uL (ref 0.7–4.0)
MCH: 29.8 pg (ref 26.0–34.0)
MCHC: 34.5 g/dL (ref 30.0–36.0)
MCV: 86.4 fL (ref 78.0–100.0)
MONO ABS: 0.6 10*3/uL (ref 0.1–1.0)
MONOS PCT: 6 %
NEUTROS ABS: 4.9 10*3/uL (ref 1.7–7.7)
Neutrophils Relative %: 55 %
Platelets: 178 10*3/uL (ref 150–400)
RBC: 5.3 MIL/uL (ref 4.22–5.81)
RDW: 13.9 % (ref 11.5–15.5)
WBC: 9 10*3/uL (ref 4.0–10.5)

## 2016-04-24 LAB — SALICYLATE LEVEL

## 2016-04-24 LAB — RAPID URINE DRUG SCREEN, HOSP PERFORMED
AMPHETAMINES: NOT DETECTED
BARBITURATES: NOT DETECTED
BENZODIAZEPINES: NOT DETECTED
COCAINE: NOT DETECTED
Opiates: NOT DETECTED
Tetrahydrocannabinol: NOT DETECTED

## 2016-04-24 LAB — ACETAMINOPHEN LEVEL: Acetaminophen (Tylenol), Serum: <10 ug/mL — ABNORMAL LOW (ref 10–30)

## 2016-04-24 LAB — ETHANOL: ALCOHOL ETHYL (B): 286 mg/dL — AB (ref <5)

## 2016-04-24 NOTE — Progress Notes (Signed)
Patient listed as not having a pcp or insurance [place].  [facility], informed patient of services there.  EDCM also provided patient with list of pcps who accept self pay patients, list of discount pharmacies and websites needymeds.org and GoodRX.com for medication assistance, phone number to inquire about the orange card, phone number to inquire about Medicaid, phone number to inquire about the Affordable Care Act, financial resources in the community such as local churches, salvation army, urban ministries, and dental assistance for uninsured patients.    No further EDCM needs at this time.  This information was placed in patient's belongings bag in his locker.
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
## 2016-04-24 NOTE — ED Provider Notes (Signed)
[facility] Provider Note   CSN: [id number] Arrival date & time: 04/24/16  2100  By signing my name below, I, Linna DarnerRussell Turner, attest that this documentation has been prepared under the direction and in the presence [employer]otiveKelly Julicia Krieger, PA-C. Electronically Signed: Linna Darnerussell Turner, Scribe. 04/24/2016. 9:57 PM.   History   Chief Complaint Chief Complaint  Patient presents with  . Suicidal    The history is provided by the patient. No language interpreter was used.    HPI Comments: Ralph Anderson is a 45 y.o. male brought in by police who presents to the Emergency Department complaining of suicidal ideation for the last week. He reports he became irritated by an "uptight" friend earlier today and strongly considered committing suicide after thinking about it all week. No suicidal plan or h/o suicide attempt. He states he started having homicidal ideation recently as well but has no specific person or plan in mind. Pt notes some alcohol use today but denies any other drug use. No other complaints at this time.   Past Medical History:  Diagnosis Date  . Hypertension     Patient Active Problem List   Diagnosis Date Noted  . Alcohol abuse with alcohol-induced mood disorder (HCC) 04/08/2016    History reviewed. No pertinent surgical history.    Home Medications    Prior to Admission medications   Not on File    Family History No family history on file.  Social History Social History  Substance Use Topics  . Smoking status: Current Every Day Smoker    Packs/day: 1.00    Types: Cigarettes  . Smokeless tobacco: Never Used  . Alcohol use 14.4 oz/week    24 Cans of beer per week     Allergies   Patient has no known allergies.   Review of Systems Review of Systems A complete 10 system review of systems was obtained and all systems are negative except as noted in the HPI and PMH.    Physical Exam Updated Vital Signs BP 100/65 (BP Location: Left Arm)   Pulse 82    Temp 97.7 F (36.5 C) (Oral)   Resp 16   Ht 5\' 7"  (1.702 m)   Wt 81.6 kg   SpO2 93%   BMI 28.19 kg/m   Physical Exam  Constitutional: He is oriented to person, place, and time. He appears well-developed and well-nourished. No distress.  HENT:  Head: Normocephalic and atraumatic.  Eyes: Conjunctivae and EOM are normal. No scleral icterus.  Neck: Normal range of motion.  Pulmonary/Chest: Effort normal. No respiratory distress.  Musculoskeletal: Normal range of motion.  Neurological: He is alert and oriented to person, place, and time.  Skin: Skin is warm and dry. No rash noted. He is not diaphoretic. No erythema. No pallor.  Psychiatric: He has a normal mood and affect. His behavior is normal.  Nursing note and vitals reviewed.    ED Treatments / Results  Labs (all labs ordered are listed, but only abnormal results are displayed) Labs Reviewed  COMPREHENSIVE METABOLIC PANEL - Abnormal; Notable for the following:       Result Value   Glucose, Bld 108 (*)    All other components within normal limits  ACETAMINOPHEN LEVEL - Abnormal; Notable for the following:    Acetaminophen (Tylenol), Serum <10 (*)    All other components within normal limits  ETHANOL - Abnormal; Notable for the following:    Alcohol, Ethyl (B) 286 (*)    All other components within normal limits  CBC WITH DIFFERENTIAL/PLATELET  SALICYLATE LEVEL  RAPID URINE DRUG SCREEN, HOSP PERFORMED    EKG  EKG Interpretation None       Radiology No results found.  Procedures Procedures (including critical care time)  DIAGNOSTIC STUDIES: Oxygen Saturation is 99% on RA, normal by my interpretation.    COORDINATION OF CARE: 10:02 PM Discussed treatment plan with pt at bedside and pt agreed to plan.  Medications Ordered in ED Medications - No data to display   Initial Impression / Assessment and Plan / ED Course  I have reviewed the triage vital signs and the nursing notes.  Pertinent labs & imaging  results that were available during my care of the patient were reviewed by me and considered in my medical decision making (see chart for details).  Clinical Course     Patient medically cleared. Pending TTS evaluation for recommendations. No indication for IVC based on my assessment, should patient choose to leave. Suspect alcohol induced mood d/o.   Final Clinical Impressions(s) / ED Diagnoses   Final diagnoses:  Alcohol-induced mood disorder (HCC)    New Prescriptions New Prescriptions   No medications on file    I personally performed the services described in this documentation, which was scribed in my presence. The recorded information has been reviewed and is accurate.      Antony MaduraKelly Liah Morr, PA-C 04/25/16 [id number]    Pricilla LovelessScott Goldston, MD 04/25/16 [phone]

## 2016-04-25 ENCOUNTER — Encounter (HOSPITAL_COMMUNITY): Payer: Self-pay

## 2016-04-25 ENCOUNTER — Inpatient Hospital Stay (HOSPITAL_COMMUNITY)
Admission: AD | Admit: 2016-04-25 | Discharge: 2016-05-01 | DRG: 885 | Disposition: A | Discharge status: Home / Self Care | Payer: Federal, State, Local not specified - Other | Source: Intra-hospital | Attending: Psychiatry | Admitting: Psychiatry

## 2016-04-25 DIAGNOSIS — F1020 Alcohol dependence, uncomplicated: Secondary | ICD-10-CM | POA: Diagnosis not present

## 2016-04-25 DIAGNOSIS — F332 Major depressive disorder, recurrent severe without psychotic features: Secondary | ICD-10-CM | POA: Diagnosis present

## 2016-04-25 DIAGNOSIS — Z811 Family history of alcohol abuse and dependence: Secondary | ICD-10-CM | POA: Diagnosis not present

## 2016-04-25 DIAGNOSIS — I10 Essential (primary) hypertension: Secondary | ICD-10-CM | POA: Diagnosis present

## 2016-04-25 DIAGNOSIS — R45851 Suicidal ideations: Secondary | ICD-10-CM

## 2016-04-25 DIAGNOSIS — F1024 Alcohol dependence with alcohol-induced mood disorder: Secondary | ICD-10-CM | POA: Clinically undetermined

## 2016-04-25 DIAGNOSIS — F17210 Nicotine dependence, cigarettes, uncomplicated: Secondary | ICD-10-CM | POA: Diagnosis present

## 2016-04-25 DIAGNOSIS — F1014 Alcohol abuse with alcohol-induced mood disorder: Secondary | ICD-10-CM

## 2016-04-25 DIAGNOSIS — Z79899 Other long term (current) drug therapy: Secondary | ICD-10-CM | POA: Diagnosis not present

## 2016-04-25 DIAGNOSIS — F411 Generalized anxiety disorder: Secondary | ICD-10-CM | POA: Diagnosis present

## 2016-04-25 DIAGNOSIS — F10230 Alcohol dependence with withdrawal, uncomplicated: Secondary | ICD-10-CM | POA: Diagnosis not present

## 2016-04-25 MED ORDER — NICOTINE POLACRILEX 2 MG MT GUM
2.0000 mg | CHEWING_GUM | OROMUCOSAL | Status: DC | PRN
  Administered 2016-04-25 – 2016-04-30 (×6): 2 mg via ORAL
  Filled 2016-04-25 (×3): qty 1

## 2016-04-25 MED ORDER — ACETAMINOPHEN 325 MG PO TABS
650.0000 mg | ORAL_TABLET | Freq: Four times a day (QID) | ORAL | Status: DC | PRN
  Administered 2016-04-25 – 2016-04-30 (×10): 650 mg via ORAL
  Filled 2016-04-25 (×10): qty 2

## 2016-04-25 MED ORDER — LORAZEPAM 1 MG PO TABS
1.0000 mg | ORAL_TABLET | ORAL | Status: DC | PRN
  Administered 2016-04-26 – 2016-04-30 (×5): 1 mg via ORAL
  Filled 2016-04-25 (×5): qty 1

## 2016-04-25 MED ORDER — TRAZODONE HCL 50 MG PO TABS
50.0000 mg | ORAL_TABLET | Freq: Every evening | ORAL | Status: DC | PRN
  Filled 2016-04-25: qty 1
  Filled 2016-04-25 (×2): qty 7
  Filled 2016-04-25: qty 1

## 2016-04-25 MED ORDER — MAGNESIUM HYDROXIDE 400 MG/5ML PO SUSP: 30.0000 mL | Freq: Every day | ORAL | Status: DC | PRN

## 2016-04-25 MED ORDER — ALUM & MAG HYDROXIDE-SIMETH 200-200-20 MG/5ML PO SUSP: 30.0000 mL | ORAL | Status: DC | PRN

## 2016-04-25 MED ORDER — HYDROXYZINE HCL 25 MG PO TABS
25.0000 mg | ORAL_TABLET | Freq: Three times a day (TID) | ORAL | Status: DC | PRN
  Administered 2016-04-25 – 2016-04-30 (×5): 25 mg via ORAL
  Filled 2016-04-25 (×7): qty 1

## 2016-04-25 NOTE — BH Assessment (Signed)
[facility] Assessment Progress Note   Clinician attempted twice to awaken patient.  Patient sleeping soundly.  Will attempt assessment again.

## 2016-04-25 NOTE — ED Notes (Signed)
Called [employer]. 

## 2016-04-25 NOTE — Progress Notes (Signed)
Pt. Presents to ED requesting assistance with ETOH detox, reporting SI without a plan.  Pt. Was discharged from ED on 11/25.  Pt. Reports passive SI Nd contracts for safety.

## 2016-04-25 NOTE — BH Assessment (Signed)
[facility] Assessment Progress Note  Per Thedore MinsMojeed Akintayo, MD, this pt requires psychiatric hospitalization at this time.  Berneice Heinrichina Tate, RN, [facility]; they will be ready to receive pt at 13:30.  Pt has signed Voluntary Admission and Consent for Treatment, as well as Consent to Release Information to no one, and signed forms have [facility].  Pt's nurse, Diane, has been notified, and agrees to send original paperwork along with pt via [facility] [place], and to call report to [phone].  Doylene Canninghomas Crystalee Ventress, MA Triage Specialist [phone]
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
## 2016-04-25 NOTE — Progress Notes (Signed)
Patient did attend the evening speaker AA meeting.  

## 2016-04-25 NOTE — Tx Team (Signed)
Initial Treatment Plan 04/25/2016 3:01 PM Ralph BaconMichael Gagner ZOX:[id number]    PATIENT STRESSORS: Financial difficulties Occupational concerns Substance abuse   PATIENT STRENGTHS: Wellsite geologistCommunication skills General fund of knowledge Motivation for treatment/growth Physical Health   PATIENT IDENTIFIED PROBLEMS: Depression  Substance abuse   Suicidal ideation  "I don't want to have suicidal thoughts again"  "Control my drinking a little better than I have been."             DISCHARGE CRITERIA:  Improved stabilization in mood, thinking, and/or behavior Verbal commitment to aftercare and medication compliance  PRELIMINARY DISCHARGE PLAN: Outpatient therapy Medication management  PATIENT/FAMILY INVOLVEMENT: This treatment plan has been presented to and reviewed with the patient, Ralph Anderson.  The patient and family have been given the opportunity to ask questions and make suggestions.  Levin BaconHeather V Nana Hoselton, RN 04/25/2016, 3:01 PM

## 2016-04-25 NOTE — Progress Notes (Signed)
[facility] Introduced self to pt. Pt denied HI, AVH, and current SI, though he feels he might be unable to contract for safety if he left. Pt denied S&S of withdrawal except tremors and minor HA. He denied needs/concerns. Pt was calm, cooperative, and pleasant. Urged pt to approach staff with needs and concerns. Pt contracted for safety.

## 2016-04-25 NOTE — ED Notes (Signed)
Introduced self to patient. Pt oriented to unit expectations.  Assessed pt for:  A) Anxiety &/or agitation: Pt has been calm and cooperative. He reports being homeless and drinking alcohol. He denies using drugs. Sometimes of sees shadows of someone in the distance that turns out to be no one, but not today.   S) Safety: Safety maintained with q-15-minute checks and hourly rounds by staff.  A) ADLs: Pt able to perform ADLs independently.  P) Pick-Up (room cleanliness): Pt's room clean and free of clutter.

## 2016-04-25 NOTE — Progress Notes (Signed)
Ralph Anderson is a 45 year old male being admitted voluntarily to [facility] from [facility].  He came to the ED with suicidal ideation with plan to walk into traffic and substance abuse.  He is currently homeless and had charges for DUI.  Court date 04/27/16.  He questioned "who will write me a letter for court?"  Encouraged him to talk with MD/SW about letter.  He denies any medical issues and appears to be in no physical distress.  Admission paperwork completed and signed.  Belongings searched and secured in locker # 32.  Skin assessment completed and noted rash on lower abdomen "from my belt buckle."  Q 15 minute checks initiated for safety.  We will monitor the progress towards his goals.

## 2016-04-25 NOTE — ED Notes (Signed)
[facility].

## 2016-04-26 DIAGNOSIS — Z79899 Other long term (current) drug therapy: Secondary | ICD-10-CM

## 2016-04-26 DIAGNOSIS — Z811 Family history of alcohol abuse and dependence: Secondary | ICD-10-CM

## 2016-04-26 DIAGNOSIS — F1020 Alcohol dependence, uncomplicated: Secondary | ICD-10-CM | POA: Diagnosis present

## 2016-04-26 MED ORDER — SERTRALINE HCL 50 MG PO TABS
50.0000 mg | ORAL_TABLET | Freq: Every day | ORAL | Status: DC
  Administered 2016-04-26 – 2016-04-30 (×5): 50 mg via ORAL
  Filled 2016-04-26 (×6): qty 1
  Filled 2016-04-26: qty 7
  Filled 2016-04-26: qty 1

## 2016-04-26 MED ORDER — VITAMIN B-1 100 MG PO TABS
100.0000 mg | ORAL_TABLET | Freq: Every day | ORAL | Status: DC
  Administered 2016-04-26 – 2016-04-30 (×5): 100 mg via ORAL
  Filled 2016-04-26 (×7): qty 1

## 2016-04-26 MED ORDER — AMLODIPINE BESYLATE 5 MG PO TABS
5.0000 mg | ORAL_TABLET | Freq: Every day | ORAL | Status: DC
  Administered 2016-04-26 – 2016-04-28 (×3): 5 mg via ORAL
  Filled 2016-04-26 (×4): qty 1

## 2016-04-26 NOTE — [facility] Suicide Risk Assessment (Signed)
[facility]   Nursing information obtained from:  Patient Demographic factors:  Male, Caucasian, Low socioeconomic status, Unemployed, Living alone Current Mental Status:  NA Loss Factors:  Decrease in vocational status, Financial problems / change in socioeconomic status, Legal issues (DWI-Thursday 04/27/16) Historical Factors:  NA Risk Reduction Factors:  NA  Total Time spent with patient: 30 minutes Principal Problem: Alcohol dependence (HCC) Diagnosis:   Patient Active Problem List   Diagnosis Date Noted  . Alcohol dependence (HCC) [F10.20] 04/26/2016  . MDD (major depressive disorder), recurrent severe, without psychosis (HCC) [F33.2] 04/25/2016   Subjective Data: Patient relates he is not having any suicidal or homicidal ideation, plan or intent at time of interview, although he did have some suicidal ideation without plan earlier that day.  Continued Clinical Symptoms:  Alcohol Use Disorder Identification Test Final Score (AUDIT): 19 The "Alcohol Use Disorders Identification Test", Guidelines for Use in Primary Care, Second Edition.  World Science writerHealth Organization [facility](WHO). Score between 0-7:  no or low risk or alcohol related problems. Score between 8-15:  moderate risk of alcohol related problems. Score between 16-19:  high risk of alcohol related problems. Score 20 or above:  warrants further diagnostic evaluation for alcohol dependence and treatment.   CLINICAL FACTORS:   Alcohol/Substance Abuse/Dependencies   Musculoskeletal: Strength & Muscle Tone: within normal limits Gait & Station: normal Patient leans: N/A  Psychiatric Specialty Exam: Physical Exam  ROS  Blood pressure (!) 134/97, pulse 79, temperature 97.7 F (36.5 C), resp. rate 16, height 5\' 6"  (1.676 m), weight 79.4 kg (175 lb), SpO2 97 %.Body mass index is 28.25 kg/m.   General Appearance: Casual  Eye Contact:  Good  Speech:  Clear and Coherent  Volume:  Normal  Mood:  Anxious   Affect:  Appropriate  Thought Process:  Coherent  Orientation:  Negative  Thought Content:  Negative  Suicidal Thoughts:  Yes.  without intent/plan  Homicidal Thoughts:  No  Memory:  Negative  Judgement:  Fair  Insight:  Fair  Psychomotor Activity:  Normal  Concentration:  Concentration: Good  Recall:  Good  Fund of Knowledge:  Good  Language:  Good  Akathisia:  No  Handed:  Right  AIMS (if indicated):     Assets:  Resilience  ADL's:  Intact  Cognition:  WNL  Sleep:  Number of Hours: 6.75     COGNITIVE FEATURES THAT CONTRIBUTE TO RISK:  None    SUICIDE RISK:   Moderate:  Frequent suicidal ideation with limited intensity, and duration, some specificity in terms of plans, no associated intent, good self-control, limited dysphoria/symptomatology, some risk factors present, and identifiable protective factors, including available and accessible social support.   PLAN OF CARE: see PAA  I certify that inpatient services furnished can reasonably be expected to improve the patient's condition.  Acquanetta SitElizabeth Woods Oates, MD 04/26/2016, 11:31 AM

## 2016-04-26 NOTE — Progress Notes (Signed)
Letter faxed to [place] [employer] regarding missed court appearance this morning at patient's request.   Samuella BruinKristin Maddy Graham, LCSW Clinical Social Worker [facility] [phone]

## 2016-04-26 NOTE — BHH Counselor (Signed)
Adult Comprehensive Assessment  Patient ID: Ralph BaconMichael Anderson, male   DOB: 02/09/1971, 45 y.o.   MRN: [id number]  Information Source:    Current Stressors:  Educational / Learning stressors: NA Employment / Job issues: pt unemployed.  [employer] employment for the past two years.  Lost most recent job when lost his housing 2 months ago. Family Relationships: NA Financial / Lack of resources (include bankruptcy): no income currently Housing / Lack of housing: homeless for past 2 months Physical health (include injuries & life threatening diseases): NA Social relationships: NA Substance abuse: current issues: 2 pending DWI charges Bereavement / Loss: NA  Living/Environment/Situation:  Living Arrangements: Other (Comment) Living conditions (as described by patient or guardian): currently homeless How long has patient lived in current situation?: lost housing 2 months ago What is atmosphere in current home: Other (Comment) (homeless)  Family History:  Marital status: Single Are you sexually active?: Yes What is your sexual orientation?: heterosexual Has your sexual activity been affected by drugs, alcohol, medication, or emotional stress?: no Does patient have children?: No  Childhood History:  By whom was/is the patient raised?: Both parents, Sibling Additional childhood history information: pt reports good upbringing in [place].  Father owned used car lot.  Strong family.  One sister, still in [place]. Description of patient's relationship with caregiver when they were a child: positive relationship Patient's description of current relationship with people who raised him/her: both parents now deceased How were you disciplined when you got in trouble as a child/adolescent?: timeout, sometimes physical discipline.  Pt reports it was appropriate discipline and "kept me out of trouble." Does patient have siblings?: Yes Number of Siblings: 1 Description of patient's current relationship  with siblings: positive relationship.  Sister is aware of his situation. Did patient suffer any verbal/emotional/physical/sexual abuse as a child?: No Did patient suffer from severe childhood neglect?: No Has patient ever been sexually abused/assaulted/raped as an adolescent or adult?: No Was the patient ever a victim of a crime or a disaster?: No Witnessed domestic violence?: No Has patient been effected by domestic violence as an adult?: No  Education:  Highest grade of school patient has completed: 12.  Financial plannerGraduated High School. Currently a student?: No Learning disability?: No  Employment/Work Situation:   Employment situation: Unemployed Patient's job has been impacted by current illness: No What is the longest time patient has a held a job?: 6 years [place]  [id number]. Where was the patient employed at that time?: [place] [employer] [place] [employer] Has patient ever been in the [employer] and Companymilitary?: No Has patient ever served in combat?: No Did You Receive Any Psychiatric Treatment/Services While in Equities traderthe Military?: No Are There Guns or Other Weapons in Your Home?: No Are These Geophysical data processorWeapons Safely Secured?:  (na)  Financial Resources:   Financial resources: [employer] stamps (worked one day at [employer] agency last week.) Does patient have a Lawyerrepresentative payee or guardian?: No  Alcohol/Substance Abuse:   What has been your use of drugs/alcohol within the last 12 months?: currently drinking 6 12 oz beers 4-5 days per week for past 2-3 years.   If attempted suicide, did drugs/alcohol play a role in this?: No Alcohol/Substance Abuse Treatment Hx: Denies past history If yes, describe treatment: Has attended a few AA meetings in past Has alcohol/substance abuse ever caused legal problems?: Yes (2 pending DWI charges)  Social Support System:   Patient's Community Support System: Poor Describe Community Support System: [place], [place]. Type of faith/religion:  Baptist. 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, construction In what areas does patient struggle / problems for patient: drinking, unemployment, homelessness  Discharge Plan:   Does patient have access to transportation?: No Will patient be returning to same living situation after discharge?: Yes Currently receiving community mental health services: No If no, would patient like referral for services when discharged?: Yes (What county?) Medical sales representative([place]) Does patient have financial barriers related to discharge medications?: Yes Patient description of barriers related to discharge medications: no insurance  Summary/Recommendations:   Summary and Recommendations (to be completed by the evaluator): Pt is 45 year old male admitted due to SI after argument with his girlfriend.  Pt reports he is drinking 6 beers 4-5 days per week currently.  Pt reports chronic alcohol abuse over the past 2-3 years.  [facility]  CSW assessing for appropriate referrals.  Recomendations include crisis stabilization, therapeutic milieu, encourage group attendance and participation, medication management, and development of comprehensive mental wellness and sobriety plan.  Ralph Anderson, Ralph Anderson. 04/26/2016

## 2016-04-26 NOTE — Progress Notes (Signed)
D: Pt was in his room upon initial approach.  Pt presents with anxious, depressed affect and mood.  Pt reports his goal is to "get my suicide thoughts down and cut down on the beer."  [facility], but reports he would feel suicidal "if I get back out on the street."  Pt denies HI, denies hallucinations, reports back pain of 4/10.  Pt has been visible in milieu interacting with peers and staff appropriately.  Pt attended evening group.   A: Introduced self to pt.  Actively listened to pt and offered support and encouragement. PRN medication administered for anxiety and pain. R: Pt is safe on the unit.  Pt is compliant with medications.  Pt verbally contracts for safety.  Will continue to monitor and assess.

## 2016-04-26 NOTE — Tx Team (Signed)
Interdisciplinary Treatment and Diagnostic Plan Update  04/26/2016 Time of Session: 0930 Ralph BaconMichael Mackowiak MRN: [id number]  Principal Diagnosis: Alcohol dependence (HCC)  Secondary Diagnoses: Principal Problem:   Alcohol dependence (HCC) Active Problems:   MDD (major depressive disorder), recurrent severe, without psychosis (HCC)   Current Medications:  Current Facility-Administered Medications  Medication Dose Route Frequency Provider Last Rate Last Dose  . acetaminophen (TYLENOL) tablet 650 mg  650 mg Oral Q6H PRN Charm RingsJamison Y Lord, NP   650 mg at 04/26/16 0809  . alum & mag hydroxide-simeth (MAALOX/MYLANTA) 200-200-20 MG/5ML suspension 30 mL  30 mL Oral Q4H PRN Charm RingsJamison Y Lord, NP      . amLODipine (NORVASC) tablet 5 mg  5 mg Oral Daily Acquanetta SitElizabeth Woods Oates, MD   5 mg at 04/26/16 1037  . hydrOXYzine (ATARAX/VISTARIL) tablet 25 mg  25 mg Oral TID PRN Acquanetta SitElizabeth Woods Oates, MD   25 mg at 04/25/16 2004  . LORazepam (ATIVAN) tablet 1 mg  1 mg Oral Q4H PRN Acquanetta SitElizabeth Woods Oates, MD   1 mg at 04/26/16 0809  . magnesium hydroxide (MILK OF MAGNESIA) suspension 30 mL  30 mL Oral Daily PRN Charm RingsJamison Y Lord, NP      . nicotine polacrilex (NICORETTE) gum 2 mg  2 mg Oral PRN Acquanetta SitElizabeth Woods Oates, MD   2 mg at 04/26/16 [phone]  . sertraline (ZOLOFT) tablet 50 mg  50 mg Oral Daily Acquanetta SitElizabeth Woods Oates, MD   50 mg at 04/26/16 1036  . thiamine (VITAMIN B-1) tablet 100 mg  100 mg Oral Daily Acquanetta SitElizabeth Woods Oates, MD   100 mg at 04/26/16 1037  . traZODone (DESYREL) tablet 50 mg  50 mg Oral QHS PRN Acquanetta SitElizabeth Woods Oates, MD       PTA Medications: No prescriptions prior to admission.    Patient Stressors: Financial difficulties Occupational concerns Substance abuse  Patient Strengths: DentistCommunication skills General fund of knowledge Motivation for treatment/growth Physical Health  Treatment Modalities: Medication Management, Group therapy, Case management,  1 to 1 session with clinician, Psychoeducation,  Recreational therapy.   Physician Treatment Plan for Primary Diagnosis: Alcohol dependence (HCC) Long Term Goal(s):     Short Term Goals:    Medication Management: Evaluate patient's response, side effects, and tolerance of medication regimen.  Therapeutic Interventions: 1 to 1 sessions, Unit Group sessions and Medication administration.  Evaluation of Outcomes: Progressing  Physician Treatment Plan for Secondary Diagnosis: Principal Problem:   Alcohol dependence (HCC) Active Problems:   MDD (major depressive disorder), recurrent severe, without psychosis (HCC)  Long Term Goal(s):     Short Term Goals:       Medication Management: Evaluate patient's response, side effects, and tolerance of medication regimen.  Therapeutic Interventions: 1 to 1 sessions, Unit Group sessions and Medication administration.  Evaluation of Outcomes: Progressing   RN Treatment Plan for Primary Diagnosis: Alcohol dependence (HCC) Long Term Goal(s): Knowledge of disease and therapeutic regimen to maintain health will improve  Short Term Goals: Ability to remain free from injury will improve, Ability to disclose and discuss suicidal ideas and Ability to identify and develop effective coping behaviors will improve  Medication Management: RN will administer medications as ordered by provider, will assess and evaluate patient's response and provide education to patient for prescribed medication. RN will report any adverse and/or side effects to prescribing provider.  Therapeutic Interventions: 1 on 1 counseling sessions, Psychoeducation, Medication administration, Evaluate responses to treatment, Monitor vital signs and CBGs as ordered, Perform/monitor CIWA, COWS, AIMS and  Fall Risk screenings as ordered, Perform wound care treatments as ordered.  Evaluation of Outcomes: Progressing   LCSW Treatment Plan for Primary Diagnosis: Alcohol dependence (HCC) Long Term Goal(s): Safe transition to appropriate  next level of care at discharge, Engage patient in therapeutic group addressing interpersonal concerns.  Short Term Goals: Engage patient in aftercare planning with referrals and resources, Facilitate patient progression through stages of change regarding substance use diagnoses and concerns and Increase skills for wellness and recovery  Therapeutic Interventions: Assess for all discharge needs, 1 to 1 time with Social worker, Explore available resources and support systems, Assess for adequacy in community support network, Educate family and significant other(s) on suicide prevention, Complete Psychosocial Assessment, Interpersonal group therapy.  Evaluation of Outcomes: Progressing   Progress in Treatment: Attending groups: No. Participating in groups: No. Taking medication as prescribed: Yes. Toleration medication: Yes. Family/Significant other contact made: No, will contact:  Pt's girlfriend Patient understands diagnosis: Yes. Discussing patient identified problems/goals with staff: Yes. Medical problems stabilized or resolved: Yes. Denies suicidal/homicidal ideation: Yes. Issues/concerns per patient self-inventory: No. Other: na   New problem(s) identified: No, Describe:  na  New Short Term/Long Term Goal(s): Medication stabilization, detox, development of comprehensive discharge plan.  Discharge Plan or Barriers: CSW assessing for appropriate referrals.  Reason for Continuation of Hospitalization: Depression Medication stabilization Withdrawal symptoms  Estimated Length of Stay:3-5 days.  Attendees: Patient: 04/26/2016 11:16 AM  Physician: Dr Mckinley Jewelates 04/26/2016 11:16 AM  Nursing: Ned CardKaren RN, Dan RN 04/26/2016 11:16 AM  RN Care Manager:Jennifer Chestine Sporelark 04/26/2016 11:16 AM  Social Worker: Clovis RileyHeather, Kristin, Tammy SoursGreg 04/26/2016 11:16 AM  Recreational Therapist:  04/26/2016 11:16 AM  Other: Armandina StammerAgnes Nwoko, NP, Gray BernhardtMay Augustin, NP 04/26/2016 11:16 AM  Other:  04/26/2016 11:16 AM  Other:  04/26/2016 11:16 AM    Scribe for Treatment Team: Lorri FrederickWierda, Albertha Beattie Jon, Counselor 04/26/2016 11:16 AM

## 2016-04-26 NOTE — Progress Notes (Signed)
D: Ralph Anderson has been cooperative and appropriate on the unit today. He's been present for unit activities, though he tends to stay to the sidelines. He denied SI this morning, though he admitted that he had experienced suicidal thoughts upon awakening. He said he thought he should try to leave and kill himself. He contracts for safety. He denied any HI or AVH. He rated his feelings of depression, anxiety, and hopelessness all 6/10. He reports experiencing tremors, cravings, and a runny nose. He denied any concerns or adverse effects and said he hadn't taken medication before depression or HTN before coming here.  A: Meds given as ordered. Q15 safety checks maintained. Support/encouragement offered.  R: Pt remains free from harm and continues with treatment. Will continue to monitor for needs/safety.

## 2016-04-26 NOTE — Progress Notes (Signed)
Recreation Therapy Notes  Date: 04/26/16 Time: 0930 Location: 300 Hall Dayroom  Group Topic: Stress Management  Goal Area(s) Addresses:  Patient will verbalize importance of using healthy stress management.  Patient will identify positive emotions associated with healthy stress management.   Behavioral Response: Engaged  Intervention: Calm App  Activity :  Letting Go Meditation.  LRT introduced the stress management technique of meditation.  LRT played the meditation to allow patients to fully engage in the meditation.  Patients were to follow along as meditation played.   Education:  Stress Management, Discharge Planning.   Education Outcome: Acknowledges edcuation/In group clarification offered/Needs additional education  Clinical Observations/Feedback: Pt attended group.   Ralph Anderson, LRT/CTRS         Ralph RancherLindsay, Ralph Anderson 04/26/2016 12:29 PM

## 2016-04-26 NOTE — H&P (Signed)
Psychiatric Admission Assessment Adult  Patient Identification: Ralph Anderson MRN:  [id number] Date of Evaluation:  04/26/2016 Chief Complaint:  MDD RECURRENT,MODERATE ETOH USE DISORDER,SEVERE Principal Diagnosis: Alcohol dependence ([place]) Diagnosis:   Patient Active Problem List   Diagnosis Date Noted  . Alcohol dependence ([place]) [F10.20] 04/26/2016  . MDD (major depressive disorder), recurrent severe, without psychosis (Ralph Anderson) [F33.2] 04/25/2016   History of Present Illness: Patient presented to the emergency room with complaints of increased alcohol use and depression and suicidal ideation.  Patient reports he began drinking at about age 45 or 73 but did not feel it was a problem until about 2 years ago. He reports that currently he is drinking 48-15 beers a week on average, sometimes more if he has the money. He reports a history of tolerance, withdrawing, trying to cut back and use despite consequences.  He reports a recent stressor has been that he recently lost his housing. He was living with a male friend who got sick and had to go to a nursing home. Her family offered to let him buy and take over the mobile home they had been living in but he did not have the money at the time and ended up "on the street." He states this is not a usual thing for him and he has not been homeless before "I always had a place to stay, always worked, had a vehicle." Another stressor is that he recently got a DWI which is his third over a period of 12 years and he has a court date scheduled for this Thursday.  He reports that he does not suffer from chronic depression but for the past 6 months has been feeling increasing depressed mood, decreased energy decreased interest in things decreased concentration, increased use of alcohol and has developed suicidal ideation. He states this morning he had suicidal ideation thinking "why am I still here, I need to get out of here, I need to hurt myself." He denies any  current plan or intent to harm himself or others. He denies any prior treatment for either psychiatric issues or substance issues.  Patient reports he does tend to be anxious and worry excessively about what might happen and he states this is a trait he shares with his late mother.  Patient denies any trauma history.  He does report that he has had high blood pressure measured repeatedly but has not taken anything for it.  Patient moved here from [place] 27 years ago after his father passed away. 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 He is single and does not have any children. Associated Signs/Symptoms: Depression Symptoms:  depressed mood, anhedonia, difficulty concentrating, suicidal thoughts without plan, anxiety, (Hypo) Manic Symptoms:  none Anxiety Symptoms:  Excessive Worry, Psychotic Symptoms:  none PTSD Symptoms: Negative Total Time spent with patient: 30 minutes  Past Psychiatric History: denies  Is the patient at risk to self? Yes.    Has the patient been a risk to self in the past 6 months? Yes.    Has the patient been a risk to self within the distant past? No.  Is the patient a risk to others? No.  Has the patient been a risk to others in the past 6 months? No.  Has the patient been a risk to others within the distant past? No.   Prior Inpatient Therapy:  no Prior Outpatient Therapy:  no  Alcohol Screening: 1. How often do you have a drink containing  alcohol?: 4 or more times a week 2. How many drinks containing alcohol do you have on a typical day when you are drinking?: 10 or more 3. How often do you have six or more drinks on one occasion?: Daily or almost daily Preliminary Score: 8 4. How often during the last year have you found that you were not able to stop drinking once you had started?: Never 5. How often during the last year have you failed to do what was normally expected from you becasue of drinking?:  Never 6. How often during the last year have you needed a first drink in the morning to get yourself going after a heavy drinking session?: Less than monthly 7. How often during the last year have you had a feeling of guilt of remorse after drinking?: Never 8. How often during the last year have you been unable to remember what happened the night before because you had been drinking?: Monthly 9. Have you or someone else been injured as a result of your drinking?: No 10. Has a relative or friend or a doctor or another health worker been concerned about your drinking or suggested you cut down?: Yes, during the last year Alcohol Use Disorder Identification Test Final Score (AUDIT): 19 Brief Intervention: Yes Substance Abuse History in the last 12 months:  Yes.   Consequences of Substance Abuse: Medical Consequences:  Hospitalized Legal Consequences:  Pending DWI his third Withdrawal Symptoms:   Tremors Previous Psychotropic Medications: No  Psychological Evaluations: No  Past Medical History:  Past Medical History:  Diagnosis Date  . Hypertension    History reviewed. No pertinent surgical history. Family History: History reviewed. No pertinent family history. Family Psychiatric  History: Mother with alcohol and anxiety issues Tobacco Screening: Have you used any form of tobacco in the last 30 days? (Cigarettes, Smokeless Tobacco, Cigars, and/or Pipes): Yes Tobacco use, Select all that apply: 5 or more cigarettes per day Are you interested in Tobacco Cessation Medications?: Yes, will notify MD for an order Counseled patient on smoking cessation including recognizing danger situations, developing coping skills and basic information about quitting provided: Refused/Declined practical counseling Social History:  History  Alcohol Use  . 14.4 oz/week  . 24 Cans of beer per week     History  Drug Use No    Comment: Pt stated "it's been 10 yrs since I smoked marijuana"    Additional Social  History: Marital status: Single Are you sexually active?: Yes What is your sexual orientation?: heterosexual Has your sexual activity been affected by drugs, alcohol, medication, or emotional stress?: no Does patient have children?: No    Pain Medications: None Prescriptions: N/A Over the Counter: N/A History of alcohol / drug use?: Yes Negative Consequences of Use: Personal relationships, Financial Name of Substance 1: ETOH (beer primarily) 1 - Age of First Use: 45 years of age 88 - Amount (size/oz): 10-12 beers daily 1 - Frequency: Daily use 1 - Duration: on-going 1 - Last Use / Amount: 12/11                  Allergies:  No Known Allergies Lab Results:  Results for orders placed or performed during the hospital encounter of 04/24/16 (from the past 48 hour(s))  Rapid urine drug screen (hospital performed)     Status: None   Collection Time: 04/24/16  9:42 PM  Result Value Ref Range   Opiates NONE DETECTED NONE DETECTED   Cocaine NONE DETECTED NONE DETECTED  Benzodiazepines NONE DETECTED NONE DETECTED   Amphetamines NONE DETECTED NONE DETECTED   Tetrahydrocannabinol NONE DETECTED NONE DETECTED   Barbiturates NONE DETECTED NONE DETECTED    Comment:        DRUG SCREEN FOR MEDICAL PURPOSES ONLY.  IF CONFIRMATION IS NEEDED FOR ANY PURPOSE, NOTIFY LAB WITHIN 5 DAYS.        LOWEST DETECTABLE LIMITS FOR URINE DRUG SCREEN Drug Class       Cutoff (ng/mL) Amphetamine      1000 Barbiturate      200 Benzodiazepine   960 Tricyclics       454 Opiates          300 Cocaine          300 THC              50   CBC with Differential     Status: None   Collection Time: 04/24/16  9:50 PM  Result Value Ref Range   WBC 9.0 4.0 - 10.5 K/uL   RBC 5.30 4.22 - 5.81 MIL/uL   Hemoglobin 15.8 13.0 - 17.0 g/dL   HCT 45.8 39.0 - 52.0 %   MCV 86.4 78.0 - 100.0 fL   MCH 29.8 26.0 - 34.0 pg   MCHC 34.5 30.0 - 36.0 g/dL   RDW 13.9 11.5 - 15.5 %   Platelets 178 150 - 400 K/uL    Neutrophils Relative % 55 %   Neutro Abs 4.9 1.7 - 7.7 K/uL   Lymphocytes Relative 37 %   Lymphs Abs 3.3 0.7 - 4.0 K/uL   Monocytes Relative 6 %   Monocytes Absolute 0.6 0.1 - 1.0 K/uL   Eosinophils Relative 2 %   Eosinophils Absolute 0.2 0.0 - 0.7 K/uL   Basophils Relative 0 %   Basophils Absolute 0.0 0.0 - 0.1 K/uL  Comprehensive metabolic panel     Status: Abnormal   Collection Time: 04/24/16  9:50 PM  Result Value Ref Range   Sodium 140 135 - 145 mmol/L   Potassium 4.0 3.5 - 5.1 mmol/L   Chloride 106 101 - 111 mmol/L   CO2 27 22 - 32 mmol/L   Glucose, Bld 108 (H) 65 - 99 mg/dL   BUN 11 6 - 20 mg/dL   Creatinine, Ser 0.67 0.61 - 1.24 mg/dL   Calcium 9.1 8.9 - 10.3 mg/dL   Total Protein 7.5 6.5 - 8.1 g/dL   Albumin 4.7 3.5 - 5.0 g/dL   AST 26 15 - 41 U/L   ALT 23 17 - 63 U/L   Alkaline Phosphatase 86 38 - 126 U/L   Total Bilirubin 1.1 0.3 - 1.2 mg/dL   GFR calc non Af Amer >60 >60 mL/min   GFR calc Af Amer >60 >60 mL/min    Comment: (NOTE) The eGFR has been calculated using the CKD EPI equation. This calculation has not been validated in all clinical situations. eGFR's persistently <60 mL/min signify possible Chronic Kidney Disease.    Anion gap 7 5 - 15  Acetaminophen level     Status: Abnormal   Collection Time: 04/24/16  9:50 PM  Result Value Ref Range   Acetaminophen (Tylenol), Serum <10 (L) 10 - 30 ug/mL    Comment:        THERAPEUTIC CONCENTRATIONS VARY SIGNIFICANTLY. A RANGE OF 10-30 ug/mL MAY BE AN EFFECTIVE CONCENTRATION FOR MANY PATIENTS. HOWEVER, SOME ARE BEST TREATED AT CONCENTRATIONS OUTSIDE THIS RANGE. ACETAMINOPHEN CONCENTRATIONS >150 ug/mL AT 4 HOURS AFTER INGESTION AND >  50 ug/mL AT 12 HOURS AFTER INGESTION ARE OFTEN ASSOCIATED WITH TOXIC REACTIONS.   Salicylate level     Status: None   Collection Time: 04/24/16  9:50 PM  Result Value Ref Range   Salicylate Lvl <9.3 2.8 - 30.0 mg/dL  Ethanol     Status: Abnormal   Collection Time: 04/24/16   9:50 PM  Result Value Ref Range   Alcohol, Ethyl (B) 286 (H) <5 mg/dL    Comment:        LOWEST DETECTABLE LIMIT FOR SERUM ALCOHOL IS 5 mg/dL FOR MEDICAL PURPOSES ONLY     Blood Alcohol level:  Lab Results  Component Value Date   ETH 286 (H) 04/24/2016   ETH <5 90/30/0923    Metabolic Disorder Labs:  No results found for: HGBA1C, MPG No results found for: PROLACTIN No results found for: CHOL, TRIG, HDL, CHOLHDL, VLDL, LDLCALC  Current Medications: Current Facility-Administered Medications  Medication Dose Route Frequency Provider Last Rate Last Dose  . acetaminophen (TYLENOL) tablet 650 mg  650 mg Oral Q6H PRN Patrecia Pour, NP   650 mg at 04/26/16 0809  . alum & mag hydroxide-simeth (MAALOX/MYLANTA) 200-200-20 MG/5ML suspension 30 mL  30 mL Oral Q4H PRN Patrecia Pour, NP      . amLODipine (NORVASC) tablet 5 mg  5 mg Oral Daily Linard Millers, MD   5 mg at 04/26/16 1037  . hydrOXYzine (ATARAX/VISTARIL) tablet 25 mg  25 mg Oral TID PRN Linard Millers, MD   25 mg at 04/25/16 2004  . LORazepam (ATIVAN) tablet 1 mg  1 mg Oral Q4H PRN Linard Millers, MD   1 mg at 04/26/16 0809  . magnesium hydroxide (MILK OF MAGNESIA) suspension 30 mL  30 mL Oral Daily PRN Patrecia Pour, NP      . nicotine polacrilex (NICORETTE) gum 2 mg  2 mg Oral PRN Linard Millers, MD   2 mg at 04/26/16 [phone]  . sertraline (ZOLOFT) tablet 50 mg  50 mg Oral Daily Linard Millers, MD   50 mg at 04/26/16 1036  . thiamine (VITAMIN B-1) tablet 100 mg  100 mg Oral Daily Linard Millers, MD   100 mg at 04/26/16 1037  . traZODone (DESYREL) tablet 50 mg  50 mg Oral QHS PRN Linard Millers, MD       PTA Medications: No prescriptions prior to admission.    Musculoskeletal: Strength & Muscle Tone: within normal limits Gait & Station: normal Patient leans: N/A  Psychiatric Specialty Exam: Physical Exam  ROS  Blood pressure (!) 134/97, pulse 79, temperature 97.7 F  (36.5 C), resp. rate 16, height 5' 6"  (1.676 m), weight 79.4 kg (175 lb), SpO2 97 %.Body mass index is 28.25 kg/m.  General Appearance: Casual  Eye Contact:  Good  Speech:  Clear and Coherent  Volume:  Normal  Mood:  Anxious  Affect:  Appropriate  Thought Process:  Coherent  Orientation:  Negative  Thought Content:  Negative  Suicidal Thoughts:  Yes.  without intent/plan  Homicidal Thoughts:  No  Memory:  Negative  Judgement:  Fair  Insight:  Fair  Psychomotor Activity:  Normal  Concentration:  Concentration: Good  Recall:  Good  Fund of Knowledge:  Good  Language:  Good  Akathisia:  No  Handed:  Right  AIMS (if indicated):     Assets:  Resilience  ADL's:  Intact  Cognition:  WNL  Sleep:  Number of Hours: 6.75  Treatment Plan Summary: Daily contact with patient to assess and evaluate symptoms and progress in treatment, Medication management and We will continue with when necessary Ativan and she was for now for monitor for withdrawal. We'll begin low-dose Norvasc for treatment of long-standing hypertension. We will start Zoloft 50 mg by mouth every morning. Patient denied discussed risks, benefits, side effects, expected effects dosing and rationale and patient agrees to trial of these medications at this time. Patient will meet with social worker to discuss alternatives for further treatment after he finishes his detoxification.  Observation Level/Precautions:  15 minute checks  Laboratory:  see labs  Psychotherapy:    Medications:    Consultations:    Discharge Concerns:    Estimated LOS:  Other:     Physician Treatment Plan for Primary Diagnosis: Alcohol dependence ([place]) Long Term Goal(s): Improvement in symptoms so as ready for discharge  Short Term Goals: Ability to verbalize feelings will improve, Ability to disclose and discuss suicidal ideas and Ability to maintain clinical measurements within normal limits will improve  Physician Treatment Plan for Secondary  Diagnosis: Principal Problem:   Alcohol dependence ([place]) Active Problems:   MDD (major depressive disorder), recurrent severe, without psychosis ([place])  Long Term Goal(s): Improvement in symptoms so as ready for discharge  Short Term Goals: Ability to identify changes in lifestyle to reduce recurrence of condition will improve and Ability to identify triggers associated with substance abuse/mental health issues will improve  I certify that inpatient services furnished can reasonably be expected to improve the patient's condition.    Linard Millers, MD 12/13/201711:15 AM

## 2016-04-26 NOTE — Plan of Care (Signed)
Problem: Safety: Goal: Ability to disclose and discuss suicidal ideas will improve Outcome: Progressing Pt admitted SI in the a.m., but said he is no longer experiencing suicidal thoughts. He contracts for safety.

## 2016-04-26 NOTE — [facility] Group Notes (Signed)
[facility] LCSW Group Therapy 04/26/2016  1:15 PM Type of Therapy: Group Therapy Participation Level: Minimal  Participation Quality: Limited  Affect: Appropriate  Cognitive: Alert and Oriented  Insight: Developing/Improving and Engaged  Engagement in Therapy: Developing/Improving and Engaged  Modes of Intervention: Clarification, Confrontation, Discussion, Education, Exploration, Limit-setting, Orientation, Problem-solving, Rapport Building, Dance movement psychotherapisteality Testing, Socialization and Support  Summary of Progress/Problems: The topic for group today was emotional regulation. This group focused on both positive and negative emotion identification and allowed group members to process ways to identify feelings, regulate negative emotions, and find healthy ways to manage internal/external emotions. Group members were asked to reflect on a time when their reaction to an emotion led to a negative outcome and explored how alternative responses using emotion regulation would have benefited them. Group members were also asked to discuss a time when emotion regulation was utilized when a negative emotion was experienced. Patient did not participate in discussion despite CSW encouragement.   Samuella BruinKristin Mackinzee Roszak, MSW, LCSW Clinical Social Worker [facility] [phone]

## 2016-04-27 LAB — TSH: TSH: 5.281 u[IU]/mL — ABNORMAL HIGH (ref 0.350–4.500)

## 2016-04-27 LAB — VITAMIN B12: Vitamin B-12: 200 pg/mL (ref 180–914)

## 2016-04-27 NOTE — Progress Notes (Signed)
[facility]  04/27/2016 9:11 AM  Patient Active Problem List   Diagnosis Date Noted  . Alcohol dependence ([facility]) 04/26/2016  . MDD (major depressive disorder), recurrent severe, without psychosis ([facility]) 04/25/2016    Diagnosis: Alcohol use disorder, severe, also depression  Subjective: Patient denies any significant withdrawal effects and he denies any suicidal or homicidal ideation, plan or intent. Reports he may be feeling a bit better today although he is tired. He reports he is working with social work to set up outpatient follow-up.  Objective: Well-developed well-nourished man in no apparent distress speech and motor within normal limits mood is all right affect is unremarkable thought processes linear and goal-directed thought content no current suicidal or homicidal ideation, plan or intent alert and oriented IQ appears an average range insight and judgment are fair     Current Facility-Administered Medications (Cardiovascular):  .  amLODipine (NORVASC) tablet 5 mg     Current Facility-Administered Medications (Analgesics):  .  acetaminophen (TYLENOL) tablet 650 mg     Current Facility-Administered Medications (Other):  .  alum & mag hydroxide-simeth (MAALOX/MYLANTA) 200-200-20 MG/5ML suspension 30 mL .  hydrOXYzine (ATARAX/VISTARIL) tablet 25 mg .  LORazepam (ATIVAN) tablet 1 mg .  magnesium hydroxide (MILK OF MAGNESIA) suspension 30 mL .  nicotine polacrilex (NICORETTE) gum 2 mg .  sertraline (ZOLOFT) tablet 50 mg .  thiamine (VITAMIN B-1) tablet 100 mg .  traZODone (DESYREL) tablet 50 mg  No current outpatient prescriptions on file.  Vital Signs:Blood pressure 140/90, pulse 86, temperature 97.7 F (36.5 C), temperature source Oral, resp. rate 16, height 5\' 6"  (1.676 m), weight 79.4 kg (175 lb), SpO2 97 %.    Lab Results:  Results for orders placed or performed during the hospital encounter of 04/25/16 (from the past 48 hour(s))  TSH     Status:  Abnormal   Collection Time: 04/27/16  6:34 AM  Result Value Ref Range   TSH 5.281 (H) 0.350 - 4.500 uIU/mL    Comment: Performed by a 3rd Generation assay with a functional sensitivity of <=0.01 uIU/mL. [facility]     Physical Findings: AIMS: Facial and Oral Movements Muscles of Facial Expression: None, normal Lips and Perioral Area: None, normal Jaw: None, normal Tongue: None, normal,Extremity Movements Upper (arms, wrists, hands, fingers): None, normal Lower (legs, knees, ankles, toes): None, normal, Trunk Movements Neck, shoulders, hips: None, normal, Overall Severity Severity of abnormal movements (highest score from questions above): None, normal Incapacitation due to abnormal movements: None, normal Patient's awareness of abnormal movements (rate only patient's report): No Awareness, Dental Status Current problems with teeth and/or dentures?: No Does patient usually wear dentures?: No  CIWA:  CIWA-Ar Total: 4 COWS:      Assessment/Plan: Patient appears to be progressing well without significant detoxification issues and no current dangerousness to self and others. He will be working with social work to develop a Engineer, manufacturingdisposition plan.  Acquanetta SitElizabeth Woods Salisa Broz, MD 04/27/2016, 9:11 AM

## 2016-04-27 NOTE — Progress Notes (Signed)
D: Pt was in the day room upon initial approach.  Pt presents with anxious, depressed affect and anxious, depressed, and pleasant mood.  Pt reports "they changed my medication today, made me feel a little better."  Pt denies SI/HI, denies hallucinations, reports pain from headache of 3/10.  Pt has been visible in milieu interacting with peers and staff cautiously.  Pt attended evening group.   A: Introduced self to pt.  Actively listened to pt and offered support and encouragement. PRN medication administered for anxiety and pain. R: Pt is safe on the unit.  Pt is compliant with medications.  Pt verbally contracts for safety.  Will continue to monitor and assess.

## 2016-04-27 NOTE — Progress Notes (Signed)
Patient ID: Ralph BaconMichael Tith, male   DOB: 12/16/1970, 45 y.o.   MRN: [id number]   DAR: Pt. Denies SI/HI and A/V Hallucinations. He reports sleep is fair, appetite is good, energy level is normal, and concentration is poor. He rates depression 5/10, hopelessness 4/10, and anxiety 5/10. Patient does report pain in his legs and received PRN Tylenol for relief. He reports some withdrawal symptoms including "jitters", cravings, tremors, and runny nose. Patient's BP is elevated and MD Mckinley JewelOates is aware. He reported some blurred vision to Clinical research associatewriter and MD Mckinley Jewelates as well but gait is steady and he appears in no distress. Support and encouragement provided to the patient. Scheduled medications administered to patient per physician's orders. Patient is minimal and quiet but does attend groups and appears engaged. Q15 minute checks are maintained for safety.

## 2016-04-27 NOTE — [facility] Group Notes (Signed)
[facility] Group Notes:  Leisure and Lifestyle Changes  Date:  04/27/2016  Time:  11:30 AM  Type of Therapy:  Psychoeducational Skills  Participation Level:  Active  Participation Quality:  Appropriate and Attentive  Affect:  Flat  Cognitive:  Appropriate  Insight:  Limited  Engagement in Group:  Engaged  Modes of Intervention:  Discussion and Education  Summary of Progress/Problems: Ralph NeedleMichael attended group and was engaged. He reports not being sure about his discharge plans but he is working on them.  Ailyne Pawley E 04/27/2016, 11:30 AM

## 2016-04-27 NOTE — [facility] Suicide Risk Assessment (Signed)
[facility] INPATIENT:  Family/Significant Other Suicide Prevention Education  Suicide Prevention Education:  Contact Attempts: Macy Misenny Payne, girlfriend.  [phone] has been identified by the patient as the family member/significant other with whom the patient will be residing, and identified as the person(s) who will aid the patient in the event of a mental health crisis.  With written consent from the patient, two attempts were made to provide suicide prevention education, prior to and/or following the patient's discharge.  We were unsuccessful in providing suicide prevention education.  A suicide education pamphlet was given to the patient to share with family/significant other.  Date and time of first attempt:04/26/16, 11:25am (left voicemail requesting call back at earliest convenience.) Date and time of second attempt: 04/27/16 at 11:39am (voicemail left)  Belenda CruiseKristin L Jerre Diguglielmo 04/27/2016, 11:39 AM

## 2016-04-27 NOTE — [facility] Group Notes (Signed)
Pt attended group. Rules of the unit were explained and pt was asked how would you rate your day on a scale of 1-10? Pt stated it was a 7 nothing to and he is an outside person and like going outside.

## 2016-04-27 NOTE — Plan of Care (Signed)
Problem: Activity: Goal: Sleeping patterns will improve Outcome: Progressing Pt slept 6.75 hours last night according to flowsheet.    

## 2016-04-28 MED ORDER — AMLODIPINE BESYLATE 10 MG PO TABS
10.0000 mg | ORAL_TABLET | Freq: Every day | ORAL | Status: DC
  Administered 2016-04-29 – 2016-04-30 (×2): 10 mg via ORAL
  Filled 2016-04-28 (×2): qty 1
  Filled 2016-04-28: qty 21
  Filled 2016-04-28 (×2): qty 1

## 2016-04-28 MED ORDER — VITAMIN B-12 1000 MCG PO TABS
1000.0000 ug | ORAL_TABLET | Freq: Every day | ORAL | Status: DC
  Administered 2016-04-28 – 2016-04-30 (×3): 1000 ug via ORAL
  Filled 2016-04-28 (×5): qty 1

## 2016-04-28 NOTE — Tx Team (Cosign Needed)
Interdisciplinary Treatment and Diagnostic Plan Update  04/28/2016 Time of Session: 9:30am Ralph BaconMichael Anderson MRN: [id number]  Principal Diagnosis: Alcohol dependence (HCC)  Secondary Diagnoses: Principal Problem:   Alcohol dependence (HCC) Active Problems:   MDD (major depressive disorder), recurrent severe, without psychosis (HCC)   Current Medications:  Current Facility-Administered Medications  Medication Dose Route Frequency Provider Last Rate Last Dose  . acetaminophen (TYLENOL) tablet 650 mg  650 mg Oral Q6H PRN Charm RingsJamison Y Lord, NP   650 mg at 04/28/16 0805  . alum & mag hydroxide-simeth (MAALOX/MYLANTA) 200-200-20 MG/5ML suspension 30 mL  30 mL Oral Q4H PRN Charm RingsJamison Y Lord, NP      . Melene Muller[START ON 04/29/2016] amLODipine (NORVASC) tablet 10 mg  10 mg Oral Daily Acquanetta SitElizabeth Woods Oates, MD      . hydrOXYzine (ATARAX/VISTARIL) tablet 25 mg  25 mg Oral TID PRN Acquanetta SitElizabeth Woods Oates, MD   25 mg at 04/26/16 2003  . LORazepam (ATIVAN) tablet 1 mg  1 mg Oral Q4H PRN Acquanetta SitElizabeth Woods Oates, MD   1 mg at 04/26/16 0809  . magnesium hydroxide (MILK OF MAGNESIA) suspension 30 mL  30 mL Oral Daily PRN Charm RingsJamison Y Lord, NP      . nicotine polacrilex (NICORETTE) gum 2 mg  2 mg Oral PRN Acquanetta SitElizabeth Woods Oates, MD   2 mg at 04/27/16 0825  . sertraline (ZOLOFT) tablet 50 mg  50 mg Oral Daily Acquanetta SitElizabeth Woods Oates, MD   50 mg at 04/28/16 0804  . thiamine (VITAMIN B-1) tablet 100 mg  100 mg Oral Daily Acquanetta SitElizabeth Woods Oates, MD   100 mg at 04/28/16 [id number]  . traZODone (DESYREL) tablet 50 mg  50 mg Oral QHS PRN Acquanetta SitElizabeth Woods Oates, MD       PTA Medications: No prescriptions prior to admission.    Patient Stressors: Financial difficulties Occupational concerns Substance abuse  Patient Strengths: DentistCommunication skills General fund of knowledge Motivation for treatment/growth Physical Health  Treatment Modalities: Medication Management, Group therapy, Case management,  1 to 1 session with clinician,  Psychoeducation, Recreational therapy.   Physician Treatment Plan for Primary Diagnosis: Alcohol dependence (HCC) Long Term Goal(s): Improvement in symptoms so as ready for discharge Improvement in symptoms so as ready for discharge   Short Term Goals: Ability to verbalize feelings will improve Ability to disclose and discuss suicidal ideas Ability to maintain clinical measurements within normal limits will improve Ability to identify changes in lifestyle to reduce recurrence of condition will improve Ability to identify triggers associated with substance abuse/mental health issues will improve  Medication Management: Evaluate patient's response, side effects, and tolerance of medication regimen.  Therapeutic Interventions: 1 to 1 sessions, Unit Group sessions and Medication administration.  Evaluation of Outcomes: [facility]  Physician Treatment Plan for Secondary Diagnosis: Principal Problem:   Alcohol dependence (HCC) Active Problems:   MDD (major depressive disorder), recurrent severe, without psychosis (HCC)  Long Term Goal(s): Improvement in symptoms so as ready for discharge Improvement in symptoms so as ready for discharge   Short Term Goals: Ability to verbalize feelings will improve Ability to disclose and discuss suicidal ideas Ability to maintain clinical measurements within normal limits will improve Ability to identify changes in lifestyle to reduce recurrence of condition will improve Ability to identify triggers associated with substance abuse/mental health issues will improve     Medication Management: Evaluate patient's response, side effects, and tolerance of medication regimen.  Therapeutic Interventions: 1 to 1 sessions, Unit  Group sessions and Medication administration.  Evaluation of Outcomes:  [facility]   RN Treatment Plan for Primary  Diagnosis: Alcohol dependence (HCC) Long Term Goal(s): Knowledge of disease and therapeutic regimen to maintain health will improve  Short Term Goals: Ability to remain free from injury will improve, Ability to disclose and discuss suicidal ideas and Ability to identify and develop effective coping behaviors will improve  Medication Management: RN will administer medications as ordered by provider, will assess and evaluate patient's response and provide education to patient for prescribed medication. RN will report any adverse and/or side effects to prescribing provider.  Therapeutic Interventions: 1 on 1 counseling sessions, Psychoeducation, Medication administration, Evaluate responses to treatment, Monitor vital signs and CBGs as ordered, Perform/monitor CIWA, COWS, AIMS and Fall Risk screenings as ordered, Perform wound care treatments as ordered.  Evaluation of Outcomes:  [facility]   LCSW Treatment Plan for Primary Diagnosis: Alcohol dependence (HCC) Long Term Goal(s): Safe transition to appropriate next level of care at discharge, Engage patient in therapeutic group addressing interpersonal concerns.  Short Term Goals: Engage patient in aftercare planning with referrals and resources, Facilitate patient progression through stages of change regarding substance use diagnoses and concerns and Increase skills for wellness and recovery  Therapeutic Interventions: Assess for all discharge needs, 1 to 1 time with Social worker, Explore available resources and support systems, Assess for adequacy in community support network, Educate family and significant other(s) on suicide prevention, Complete Psychosocial Assessment, Interpersonal group therapy.  Evaluation of Outcomes:  [facility]   Progress in Treatment: Attending groups: Yes Participating in groups: Yes Taking medication as  prescribed: Yes. Toleration medication: Yes. Family/Significant other contact made: CSW has attempted to contact patient's girlfriend Patient understands diagnosis: Yes. Discussing patient identified problems/goals with staff: Yes. Medical problems stabilized or resolved: Yes. Denies suicidal/homicidal ideation: Yes, denies Issues/concerns per patient self-inventory: No. Other: na   New problem(s) identified: No, Describe:  na  New Short Term/Long Term Goal(s): Medication stabilization, detox, development of comprehensive discharge plan.  Discharge Plan or Barriers: [facility] scheduled for [facility]01/16  Reason for Continuation of Hospitalization: Depression Medication stabilization Withdrawal symptoms  Estimated Length of Stay: Discharge anticipated for 05/01/16  Attendees:  Patient:              Physician: Dr. Mckinley Jewelates, MD  04/28/2016   9:30am  Nursing: Cecile Sheererhrista Dopson, Patty Duke 04/28/2016 9:30am  RN Care Manager:   Social Workers: Chad CordialLauren Carter, LCSW, Samuella BruinKristin Stephie Xu, Daleen SquibbGreg Wierda, LCSW,    04/28/2016 9:30am  Nurse Pratictioners: Gray BernhardtMay Augustin, NP, Armandina StammerAgnes Nwoko, NP 04/28/2016 9:30am   Samuella BruinKristin Willies Laviolette, LCSW Clinical Social Worker [facility] [phone]

## 2016-04-28 NOTE — Progress Notes (Signed)
Incidental note  Patient's TSH mildly elevated at 5.281 will check free T4.

## 2016-04-28 NOTE — Progress Notes (Signed)
Recreation Therapy Notes  Date: 04/28/16 Time: 0930 Location: 300 Hall Dayroom  Group Topic: Stress Management  Goal Area(s) Addresses:  Patient will verbalize importance of using healthy stress management.  Patient will identify positive emotions associated with healthy stress management.   Behavioral Response: Engaged  Intervention: Stress Management  Activity :  Progressive muscle relaxation.  LRT introduced the stress management technique of progressive muscle relaxation.  LRT read Anderson script which allowed patients to engage in the activity by tensing up each muscle group individually and then relaxing.  Patients were to follow along as LRT read script to fully participate in the technique.  Education:  Stress Management, Discharge Planning.   Education Outcome: Acknowledges edcuation/In group clarification offered/Needs additional education  Clinical Observations/Feedback: Pt attended group.   Ralph Anderson, LRT/CTRS         Ralph Anderson, Ralph Anderson 04/28/2016 10:59 AM

## 2016-04-28 NOTE — Progress Notes (Addendum)
  [facility] Discharge [facility] :  Will you be returning to the same living situation after discharge:  No. [facility] At discharge, do you have transportation home?: Yes,  patient will be provided with taxi voucher Do you have the ability to pay for your medications: Yes,  patient will be provided with prescriptions at discharge  Release of information consent forms completed and in the chart;  Patient's signature needed at discharge.  Patient to Follow up at: Follow-up Information    [facility] Follow up on 05/01/2016.   Why:  Assessment for possible admission on Monday Dec. 18th at 7:45am. Bring [place] ID, social security card, change of clothing, and a 30 day supply of medications.  Contact information: [place] [place] [address] [phone]        [facility]   Specialty:  Behavioral Health Why:  If interested in outpatient services, please go to walk-in clinic Monday-Friday at 8am for assessment for therapy and medication management.  Contact information: [address] [place] [address] [phone]           Next level of care provider has access to [facility] Link:no  Safety Planning and Suicide Prevention discussed: Yes,  with patient  Have you used any form of tobacco in the last 30 days? (Cigarettes, Smokeless Tobacco, Cigars, and/or Pipes): Yes  Has patient been referred to the Quitline?: Patient refused referral  Patient has been referred for addiction treatment: Yes  Tristin Vandeusen L Keren Alverio 04/28/2016, 10:28 AM

## 2016-04-28 NOTE — Progress Notes (Signed)
Patient ID: Ralph BaconMichael Anderson, male   DOB: 08/19/1970, 45 y.o.   MRN: [id number]  DAR: Pt. Denies SI/HI and A/V Hallucinations. He reports sleep is fair, appetite is good, energy level is low, and concentration is poor. He rates depression 5/10, hopelessness 4/10, and anxiety 6/10. Patient does report pain in his feet and received PRN Tylenol. Support and encouragement provided to the patient. Scheduled medications administered to patient per physician's orders. Patient is minimal and has little initiation but will speak and answer writers questions when writer initiates the conversation. Q15 minute checks are maintained for safety.

## 2016-04-28 NOTE — Progress Notes (Signed)
  DATA ACTION RESPONSE  Objective- Pt. is up and visible in the milieu, seen siting quietly in the dayroom. Pt. attended AA group this evening. Pt. presents with an anxious affect and mood. Subjective- Denies having any SI/HI/AH at this time. Rates pain 6/10, foot. Endorses VH stating "I see shadows sometimes when I'm walking". Pt. continues to be cooperative and pleasant on the unit.  1:1 interaction in private to establish rapport. Encouragement, education, & support given from staff. Meds. ordered and administered. PRNs Tylenol, Vistaril, and nicotine gum requested and will re-eval accordingly.   Safety maintained with Q 15 checks. Continues to follow treatment plan and will monitor closely. No additonal questions/concerns noted.

## 2016-04-28 NOTE — Plan of Care (Signed)
Problem: Medication: Goal: Compliance with prescribed medication regimen will improve Outcome: Progressing Pt is taking meds as prescribed.    

## 2016-04-28 NOTE — Progress Notes (Signed)
[facility]  04/28/2016 11:39 AM  Patient Active Problem List   Diagnosis Date Noted  . Alcohol dependence (HCC) 04/26/2016  . MDD (major depressive disorder), recurrent severe, without psychosis (HCC) 04/25/2016    Diagnosis: Alcohol use disorder, severe, history of depressive symptoms  Subjective: Patient states he is feeling a little down today secondary to being confined and not being as active and also not being sure where he will go next. Patient states however he is willing to be patient and possibly wait for a [facility] on Monday. He denies current suicidal or homicidal ideation, plan or intent. Patient's blood pressure continues to be somewhat elevated and we will advance Norvasc to 10 mg by mouth every morning. Patient's B12 was 200 with a cut off of normal of 190 and recommended to the patient that he consider supplementation after discharge taking immediate release vitamin B12 tablets thousand micrograms daily for 8-12 weeks and then rechecking and about 3-6 months or so.  Objective: Well-developed well-nourished man in no apparent distress pleasant and appropriate mood is described as somewhat down affect is unremarkable thought processes linear and goal-directed thought content no suicidal or homicidal ideation, plan or intent alert and oriented insight and judgment are fair IQ appears an average range     Current Facility-Administered Medications (Cardiovascular):  Melene Muller.  [START ON 04/29/2016] amLODipine (NORVASC) tablet 10 mg     Current Facility-Administered Medications (Analgesics):  .  acetaminophen (TYLENOL) tablet 650 mg     Current Facility-Administered Medications (Other):  .  alum & mag hydroxide-simeth (MAALOX/MYLANTA) 200-200-20 MG/5ML suspension 30 mL .  hydrOXYzine (ATARAX/VISTARIL) tablet 25 mg .  LORazepam (ATIVAN) tablet 1 mg .  magnesium hydroxide (MILK OF MAGNESIA) suspension 30 mL .  nicotine polacrilex (NICORETTE) gum 2 mg .   sertraline (ZOLOFT) tablet 50 mg .  thiamine (VITAMIN B-1) tablet 100 mg .  traZODone (DESYREL) tablet 50 mg  No current outpatient prescriptions on file.  Vital Signs:Blood pressure 134/88, pulse 83, temperature 97.8 F (36.6 C), temperature source Oral, resp. rate 18, height 5\' 6"  (1.676 m), weight 79.4 kg (175 lb), SpO2 97 %.    Lab Results:  Results for orders placed or performed during the hospital encounter of 04/25/16 (from the past 48 hour(s))  TSH     Status: Abnormal   Collection Time: 04/27/16  6:34 AM  Result Value Ref Range   TSH 5.281 (H) 0.350 - 4.500 uIU/mL    Comment: Performed by a 3rd Generation assay with a functional sensitivity of <=0.01 uIU/mL. Performed at [facility]   Vitamin B12     Status: None   Collection Time: 04/27/16  6:34 AM  Result Value Ref Range   Vitamin B-12 200 180 - 914 pg/mL    Comment: (NOTE) This assay is not validated for testing neonatal or myeloproliferative syndrome specimens for Vitamin B12 levels. Performed at [facility]     Physical Findings: AIMS: Facial and Oral Movements Muscles of Facial Expression: None, normal Lips and Perioral Area: None, normal Jaw: None, normal Tongue: None, normal,Extremity Movements Upper (arms, wrists, hands, fingers): None, normal Lower (legs, knees, ankles, toes): None, normal, Trunk Movements Neck, shoulders, hips: None, normal, Overall Severity Severity of abnormal movements (highest score from questions above): None, normal Incapacitation due to abnormal movements: None, normal Patient's awareness of abnormal movements (rate only patient's report): No Awareness, Dental Status Current problems with teeth and/or dentures?: No Does patient usually wear dentures?: No  CIWA:  CIWA-Ar Total: 2 COWS:      Assessment/Plan: Hypertension increase Norvasc to 10 mg by mouth daily, borderline low B12 supplementation recommended as above. Depressive feelings continue  current medications. We will discontinue Ativan when necessary as no withdrawal noted. Current plans are for patient to [facility]. If patient desired to leave over the weekend instead and go there from his home that would be appropriate if his present course continues and he continues to deny suicidal or homicidal ideation, plan or intent.  Acquanetta SitElizabeth Woods Oreta Soloway, MD 04/28/2016, 11:39 AM

## 2016-04-28 NOTE — Progress Notes (Signed)
Patient did attend AA group meeting.  

## 2016-04-28 NOTE — Progress Notes (Signed)
Pt has been observed in the dayroom most of the evening talking with peers and watching TV.  He reports that he has had a good day and voices no needs or concerns other than needing some Tylenol at bedtime for his chronic back and shoulder pain.  He denies SI/HI/AVH.  He denies having any withdrawal symptoms at this time.  He has been pleasant and cooperative with staff and peers.  He states that he would like to go to long term rehab [facility], ans says that the CSW is working on that with him.  Support and encouragement offered.  Discharge plans are in process.  Safety maintained with q15 minute checks.

## 2016-04-28 NOTE — [facility] Group Notes (Signed)
[facility] LCSW Group Therapy 04/28/2016 1:15 PM Type of Therapy: Group Therapy Participation Level: Active  Participation Quality: Attentive, Sharing and Supportive  Affect: Depressed and Flat  Cognitive: Alert and Oriented  Insight: Developing/Improving and Engaged  Engagement in Therapy: Developing/Improving and Engaged  Modes of Intervention: Clarification, Confrontation, Discussion, Education, Exploration, Limit-setting, Orientation, Problem-solving, Rapport Building, Dance movement psychotherapisteality Testing, Socialization and Support  Summary of Progress/Problems: The topic for today was feelings about relapse. Pt discussed what relapse prevention is to them and identified triggers that they are on the path to relapse. Pt processed their feeling towards relapse and was able to relate to peers. Pt discussed coping skills that can be used for relapse prevention. Patient reports feeling "depressed" today but cannot identify any triggers. He participated minimally in discussion despite CSW encouragement.    Samuella BruinKristin Juliocesar Blasius, MSW, LCSW Clinical Social Worker [facility] [phone]

## 2016-04-29 DIAGNOSIS — F332 Major depressive disorder, recurrent severe without psychotic features: Principal | ICD-10-CM

## 2016-04-29 DIAGNOSIS — F10230 Alcohol dependence with withdrawal, uncomplicated: Secondary | ICD-10-CM

## 2016-04-29 DIAGNOSIS — R45851 Suicidal ideations: Secondary | ICD-10-CM

## 2016-04-29 LAB — T4, FREE: Free T4: 0.7 ng/dL (ref 0.61–1.12)

## 2016-04-29 NOTE — Progress Notes (Signed)
DAR Note: Ralph Anderson has been in the bed  Most of the afternoon.  He attended AM groups but in his bed resting.  He reported that he is feeling a little more tired and depressed today.  He denies SI at this time and is able to contract for safety on the unit.  "I got started on blood pressure medication and I think that is making me tired."  CIWA 4 this evening.  He reports that is appetite is ok and that he slept fairly well last night.  He completed his self inventory and reported that his depression and anxiety are 6/10 and his hopelessness is 5/10.  He reported that his goal for today was "keep planning on what to do when I get out" and he will accomplish this goal by "thinking, praying."  He c/o of feet and leg pain this morning 7/10 and tylenol given prn.  Encouraged continued participation in group and unit activities.  Q 15 minute checks maintained for safety.  We will continue to monitor the progress towards his goals.

## 2016-04-29 NOTE — Progress Notes (Signed)
Pt attended AA group meeting. 

## 2016-04-29 NOTE — Progress Notes (Signed)
[facility]  04/29/2016 3:21 PM  Patient Active Problem List   Diagnosis Date Noted  . Alcohol dependence (HCC) 04/26/2016  . MDD (major depressive disorder), recurrent severe, without psychosis (HCC) 04/25/2016    Diagnosis: Alcohol use disorder, severe, history of depressive symptoms  Subjective: No c/o today.  He states that he is tired and was surprised that the time of day.  He states that his meds are working fine for him.    Objective: Well-developed well-nourished man in no apparent distress pleasant and appropriate mood.  Per nursing, compliant with meds, non disruptive to milieu    Current Facility-Administered Medications (Cardiovascular):  .  amLODipine (NORVASC) tablet 10 mg     Current Facility-Administered Medications (Analgesics):  .  acetaminophen (TYLENOL) tablet 650 mg   Current Facility-Administered Medications (Hematological):  .  vitamin B-12 (CYANOCOBALAMIN) tablet 1,000 mcg   Current Facility-Administered Medications (Other):  .  alum & mag hydroxide-simeth (MAALOX/MYLANTA) 200-200-20 MG/5ML suspension 30 mL .  hydrOXYzine (ATARAX/VISTARIL) tablet 25 mg .  LORazepam (ATIVAN) tablet 1 mg .  magnesium hydroxide (MILK OF MAGNESIA) suspension 30 mL .  nicotine polacrilex (NICORETTE) gum 2 mg .  sertraline (ZOLOFT) tablet 50 mg .  thiamine (VITAMIN B-1) tablet 100 mg .  traZODone (DESYREL) tablet 50 mg  No current outpatient prescriptions on file.  Vital Signs:Blood pressure 121/79, pulse 89, temperature 97.5 F (36.4 C), resp. rate 18, height 5\' 6"  (1.676 m), weight 79.4 kg (175 lb), SpO2 97 %.    Lab Results:  Results for orders placed or performed during the hospital encounter of 04/25/16 (from the past 48 hour(s))  T4, free     Status: None   Collection Time: 04/29/16  6:39 AM  Result Value Ref Range   Free T4 0.70 0.61 - 1.12 ng/dL    Comment: (NOTE) Biotin ingestion may interfere with free T4 tests. If the results  are inconsistent with the TSH level, previous test results, or the clinical presentation, then consider biotin interference. If needed, order repeat testing after stopping biotin. Performed at [facility]     Physical Findings: AIMS: Facial and Oral Movements Muscles of Facial Expression: None, normal Lips and Perioral Area: None, normal Jaw: None, normal Tongue: None, normal,Extremity Movements Upper (arms, wrists, hands, fingers): None, normal Lower (legs, knees, ankles, toes): None, normal, Trunk Movements Neck, shoulders, hips: None, normal, Overall Severity Severity of abnormal movements (highest score from questions above): None, normal Incapacitation due to abnormal movements: None, normal Patient's awareness of abnormal movements (rate only patient's report): No Awareness, Dental Status Current problems with teeth and/or dentures?: No Does patient usually wear dentures?: No  CIWA:  CIWA-Ar Total: 10 COWS:     Psychiatric Specialty Exam: Physical Exam  ROS  Blood pressure 121/79, pulse 89, temperature 97.5 F (36.4 C), resp. rate 18, height 5\' 6"  (1.676 m), weight 79.4 kg (175 lb), SpO2 97 %.Body mass index is 28.25 kg/m.   General Appearance: Casual  Eye Contact:  Good  Speech:  Clear and Coherent  Volume:  Normal  Mood:  Anxious  Affect:  Appropriate  Thought Process:  Coherent  Orientation:  Negative  Thought Content:  Negative  Suicidal Thoughts:  Yes.  without intent/plan  Homicidal Thoughts:  No  Memory:  Negative  Judgement:  Fair  Insight:  Fair  Psychomotor Activity:  Normal  Concentration:  Concentration: Good  Recall:  Good  Fund of Knowledge:  Good  Language:  Good  Akathisia:  No  Handed:  Right  AIMS (if indicated):     Assets:  Resilience  ADL's:  Intact  Cognition:  WNL  Sleep:  Number of Hours: 6.75    Assessment/Plan: Hypertension increase Norvasc to 10 mg by mouth daily, Current plans are for patient to discharge [facility]. If patient desired to leave over the weekend instead and go there from his home that would be appropriate if his present course continues and he continues to deny suicidal or homicidal ideation, plan or intent.  Lindwood QuaSheila May Luccia Reinheimer, NP [facility] 04/29/2016, 3:21 PM

## 2016-04-29 NOTE — [facility] Group Notes (Signed)
Adult Therapy Group Note  Date:  04/29/2016  Time:  10:00-11:00AM  Group Topic/Focus: Unhealthy vs Healthy Coping Techniques  Building Self Esteem:    The focus of this group was to determine what unhealthy coping techniques typically are used or and what healthy coping techniques would be helpful in coping with fears related to the upcoming holidays.  An exercise was used to identify patients' fears about the upcoming holidays.   Patients were guided in becoming aware of the differences between healthy and unhealthy coping techniques  The whiteboard was utilized to record the discussion, and patients recorded healthy coping techniques specific to their expressed fears.  Participation Level:  Active  Participation Quality:  Attentive  Affect:  Blunted and Depressed  Cognitive:  Appropriate  Insight: Improving  Engagement in Group:  Improving  Modes of Intervention:  Exercise, Discussion and Support  Additional Comments:  The patient expressed little during group, but did make some contributions.  Ambrose MantleMareida Grossman-Orr, LCSW 04/29/2016   1:05 PM

## 2016-04-30 DIAGNOSIS — F1024 Alcohol dependence with alcohol-induced mood disorder: Secondary | ICD-10-CM | POA: Clinically undetermined

## 2016-04-30 MED ORDER — SERTRALINE HCL 50 MG PO TABS: 50.0000 mg | ORAL_TABLET | Freq: Every day | ORAL | 0 refills | Status: DC

## 2016-04-30 MED ORDER — TRAZODONE HCL 50 MG PO TABS: 50.0000 mg | ORAL_TABLET | Freq: Every evening | ORAL | 0 refills | Status: DC | PRN

## 2016-04-30 MED ORDER — AMLODIPINE BESYLATE 10 MG PO TABS: 10.0000 mg | ORAL_TABLET | Freq: Every day | ORAL | 0 refills | Status: DC

## 2016-04-30 MED ORDER — IBUPROFEN 800 MG PO TABS
800.0000 mg | ORAL_TABLET | Freq: Once | ORAL | Status: AC
Start: 2016-04-30 — End: 2016-04-30
  Administered 2016-04-30: 800 mg via ORAL
  Filled 2016-04-30 (×2): qty 1

## 2016-04-30 MED ORDER — HYDROXYZINE HCL 25 MG PO TABS: 25.0000 mg | ORAL_TABLET | Freq: Three times a day (TID) | ORAL | 0 refills | Status: DC | PRN

## 2016-04-30 MED ORDER — NICOTINE POLACRILEX 2 MG MT GUM: 2.0000 mg | CHEWING_GUM | OROMUCOSAL | 0 refills | Status: DC | PRN

## 2016-04-30 NOTE — [facility] Group Notes (Signed)
Adult Therapy Group Note  Date: 04/30/2016  Time:  10:00-11:00AM  Group Topic/Focus: Healthy Support Systems   Building Self Esteem:   The focus of this group was to assist patients in identifying their current healthy supports and unhealthy supports, then discussing how to add additional healthy supports and reduce existing unhealthy ones.  The whiteboard list from yesterday was used to discuss supports that could help patients to use the healthy coping skills listed as opposed to the unhealthy ones.  Cards with names of possible healthy supports on them were divided up and shared by group members, The healthy additions included supports such as 12-step groups, individual therapy, psychiatrists, faith activities,  sponsors, group therapy, support groups, classes on mental health, and more.    Participation Level:  Active  Participation Quality:  Attentive and Sharing  Affect:  Blunted and Depressed  Cognitive:  Appropriate  Insight: Fair  Engagement in Group:  Improving  Modes of Intervention:  Discussion and Support  Additional Comments:  The patient expressed little during group and had some difficult with the overzealous sharing by another group member; however, he was able to contain any negative responses.  Lynnell ChadMareida J Grossman-Orr, LCSW 04/30/2016  12:35 PM

## 2016-04-30 NOTE — Progress Notes (Signed)
Patient did attend the evening speaker AA meeting.  

## 2016-04-30 NOTE — Plan of Care (Signed)
Problem: Activity: Goal: Interest or engagement in activities will improve Outcome: Progressing Pt did attend evening AA groups this weekend   

## 2016-04-30 NOTE — Progress Notes (Signed)
Patient denies SI, HI and AVH.  Patient has complained of a high level of anxiety this shift and needed medications to help decrease anxiety.  Patient was noted to be flushed and tremulous.    Assess Patient for safety, offer medications as prescribed, engage patient in 1:1 staff talks.   Continue to monitor patient for safety.

## 2016-04-30 NOTE — Progress Notes (Signed)
D.  Pt pleasant on approach, denies complaints at this time other than some chronic leg pain.  Pt was positive for evening AA group, interacting appropriately with peers on the unit.  Pt denies SI/HI/hallucinations at this time.  A.  Support and encouragement offered, medication given as ordered.  R.  Pt remains safe on the unit, will continue to monitor.

## 2016-04-30 NOTE — Progress Notes (Signed)
D.  Pt pleasant on approach, denies complaints at this time.  Positive for evening AA group, observed interacting appropriately with peers on the unit.  Pt denies SI/HI/hallucinations at this time.  A.  Support and encouragement offered, medication given as ordered  R.  PT remains safe on the unit, will continue to monitor.

## 2016-04-30 NOTE — [facility] Suicide Risk Assessment (Signed)
[facility]   Principal Problem: MDD (major depressive disorder), recurrent severe, without psychosis (HCC) Discharge Diagnoses:  Patient Active Problem List   Diagnosis Date Noted  . Alcohol use disorder, severe, dependence (HCC) [F10.20] 04/30/2016  . MDD (major depressive disorder), recurrent severe, without psychosis (HCC) [F33.2] 04/25/2016    Total Time spent with patient: 30 minutes  Musculoskeletal: Strength & Muscle Tone: within normal limits Gait & Station: normal Patient leans: N/A  Psychiatric Specialty Exam: Review of Systems  Psychiatric/Behavioral: Positive for substance abuse. Negative for depression. The patient is nervous/anxious (improving).   All other systems reviewed and are negative.   Blood pressure 139/83, pulse 89, temperature 97.7 F (36.5 C), resp. rate 20, height 5\' 6"  (1.676 m), weight 79.4 kg (175 lb), SpO2 97 %.Body mass index is 28.25 kg/m.  General Appearance: Fairly Groomed  Patent attorneyye Contact::  Fair  Speech:  Clear and Coherent409  Volume:  Normal  Mood:  Anxious improved  Affect:  Appropriate  Thought Process:  Goal Directed and Descriptions of Associations: Intact  Orientation:  Full (Time, Place, and Person)  Thought Content:  Logical  Suicidal Thoughts:  No  Homicidal Thoughts:  No  Memory:  Immediate;   Fair Recent;   Fair Remote;   Fair  Judgement:  Fair  Insight:  Fair  Psychomotor Activity:  Normal  Concentration:  Fair  Recall:  FiservFair  Fund of Knowledge:Fair  Language: Fair  Akathisia:  No  Handed:  Right  AIMS (if indicated):     Assets:  Desire for Improvement  Sleep:  Number of Hours: 5.75  Cognition: WNL  ADL's:  Intact   Mental Status Per Nursing Assessment::   On Admission:  NA  Demographic Factors:  Male and Caucasian  Loss Factors: NA  Historical Factors: Impulsivity  Risk Reduction Factors:   Positive social support  Continued Clinical Symptoms:  Alcohol/Substance  Abuse/Dependencies Previous Psychiatric Diagnoses and Treatments  Cognitive Features That Contribute To Risk:  None    Suicide Risk:  Minimal: No identifiable suicidal ideation.  Patients presenting with no risk factors but with morbid ruminations; may be classified as minimal risk based on the severity of the depressive symptoms  Follow-up Information    [facility] Follow up on 05/01/2016.   Why:  Assessment for possible admission on Monday Dec. 18th at 7:45am. Bring [place] ID, social security card, change of clothing, and a 30 day supply of medications.  Contact information: [address] [place] [address] [phone]        [facility]   Specialty:  Behavioral Health Why:  If interested in outpatient services, please go to walk-in clinic Monday-Friday at 8am for assessment for therapy and medication management.  Contact information: [address] [place] [address] [phone]           Plan Of Care/Follow-up recommendations:  Activity:  no restrictions Diet:  regular Tests:  as needed Other:  follow up with aftercare as recommended  Izac Faulkenberry, MD 04/30/2016, 3:01 PM

## 2016-04-30 NOTE — Discharge Summary (Signed)
Physician Discharge Summary Note  Patient:  Ralph Anderson is an 45 y.o., male MRN:  [id number] DOB:  01/18/1971 Patient phone:  [phone] (home)  Patient address:   [place] [address],  Total Time spent with patient: 30 minutes  Date of Admission:  04/25/2016 Date of Discharge: 04/21/2016  Reason for Admission:  depression and suicidal ideation  Principal Problem: Alcohol dependence [facility]) Discharge Diagnoses: Patient Active Problem List   Diagnosis Date Noted  . Alcohol dependence (HCC) [F10.20] 04/26/2016  . MDD (major depressive disorder), recurrent severe, without psychosis (HCC) [F33.2] 04/25/2016    Past Psychiatric History: see HPI  Past Medical History:  Past Medical History:  Diagnosis Date  . Hypertension    History reviewed. No pertinent surgical history. Family History: History reviewed. No pertinent family history. Family Psychiatric  History: see HPI Social History:  History  Alcohol Use  . 14.4 oz/week  . 24 Cans of beer per week     History  Drug Use No    Comment: Pt stated "it's been 10 yrs since I smoked marijuana"    Social History   Social History  . Marital status: Single    Spouse name: N/A  . Number of children: N/A  . Years of education: N/A   Social History Main Topics  . Smoking status: Current Every Day Smoker    Packs/day: 1.00    Types: Cigarettes  . Smokeless tobacco: Never Used  . Alcohol use 14.4 oz/week    24 Cans of beer per week  . Drug use: No     Comment: Pt stated "it's been 10 yrs since I smoked marijuana"  . Sexual activity: Yes   Other Topics Concern  . None   Social History Narrative  . None    Hospital Course:   Ascencion Coye was admitted for MDD (major depressive disorder), recurrent severe, without psychosis (HCC) and crisis management.  Patient was treated with medications with their indications listed below in detail under Medication List.  Medical problems were identified and treated as  needed.  Home medications were restarted as appropriate.  Improvement was monitored by observation and Ronny Bacon daily report of symptom reduction.  Emotional and mental status was monitored by daily self inventory reports completed by Ronny Bacon and clinical staff.  Patient reported continued improvement, denied any new concerns.  Patient had been compliant on medications and denied side effects.  Support and encouragement was provided.    Patient encouraged to attend groups to help with recognizing triggers of emotional crises and de-stabilizations.  Patient encouraged to attend group to help identify the positive things in life that would help in dealing with feelings of loss, depression and unhealthy or abusive tendencies.         Aldahir Litaker was evaluated by the treatment team for stability and plans for continued recovery upon discharge.  Patient was offered further treatment options upon discharge including Residential, Intensive Outpatient and Outpatient treatment. Patient will follow up with agency listed below for medication management and counseling.  Encouraged patient to maintain satisfactory support network and home environment.  Advised to adhere to medication compliance and outpatient treatment follow up.  Prescriptions provided.       Sherod Cisse motivation was an integral factor for scheduling further treatment.  Employment, transportation, bed availability, health status, family support, and any pending legal issues were also considered during patient's hospital stay.  Upon completion of this admission the patient was both mentally and medically stable for discharge  denying suicidal/homicidal ideation, auditory/visual/tactile hallucinations, delusional thoughts and paranoia.      Physical Findings: AIMS: Facial and Oral Movements Muscles of Facial Expression: None, normal Lips and Perioral Area: None, normal Jaw: None, normal Tongue: None, normal,Extremity Movements Upper  (arms, wrists, hands, fingers): None, normal Lower (legs, knees, ankles, toes): None, normal, Trunk Movements Neck, shoulders, hips: None, normal, Overall Severity Severity of abnormal movements (highest score from questions above): None, normal Incapacitation due to abnormal movements: None, normal Patient's awareness of abnormal movements (rate only patient's report): No Awareness, Dental Status Current problems with teeth and/or dentures?: No Does patient usually wear dentures?: No  CIWA:  CIWA-Ar Total: 2 COWS:     Musculoskeletal: Strength & Muscle Tone: within normal limits Gait & Station: normal Patient leans: N/A  Psychiatric Specialty Exam:  See MD SRA Physical Exam  ROS  Blood pressure 139/83, pulse 89, temperature 97.7 F (36.5 C), resp. rate 20, height 5\' 6"  (1.676 m), weight 79.4 kg (175 lb), SpO2 97 %.Body mass index is 28.25 kg/m.   Have you used any form of tobacco in the last 30 days? (Cigarettes, Smokeless Tobacco, Cigars, and/or Pipes): Yes  Has this patient used any form of tobacco in the last 30 days? (Cigarettes, Smokeless Tobacco, Cigars, and/or Pipes) Yes, Rx given to patient  Blood Alcohol level:  Lab Results  Component Value Date   ETH 286 (H) 04/24/2016   ETH <5 04/08/2016    Metabolic Disorder Labs:  No results found for: HGBA1C, MPG No results found for: PROLACTIN No results found for: CHOL, TRIG, HDL, CHOLHDL, VLDL, LDLCALC  See Psychiatric Specialty Exam and Suicide Risk Assessment completed by Attending Physician prior to discharge.  Discharge destination:  Home  Is patient on multiple antipsychotic therapies at discharge:  No   Has Patient had three or more failed trials of antipsychotic monotherapy by history:  No  Recommended Plan for Multiple Antipsychotic Therapies: NA   Allergies as of 04/30/2016   No Known Allergies     Medication List    TAKE these medications     Indication  amLODipine 10 MG tablet Commonly known as:   NORVASC Take 1 tablet (10 mg total) by mouth daily. Start taking on:  05/01/2016  Indication:  High Blood Pressure Disorder   hydrOXYzine 25 MG tablet Commonly known as:  ATARAX/VISTARIL Take 1 tablet (25 mg total) by mouth 3 (three) times daily as needed for anxiety.  Indication:  Anxiety Neurosis   nicotine polacrilex 2 MG gum Commonly known as:  NICORETTE Take 1 each (2 mg total) by mouth as needed for smoking cessation.  Indication:  Nicotine Addiction   sertraline 50 MG tablet Commonly known as:  ZOLOFT Take 1 tablet (50 mg total) by mouth daily. Start taking on:  05/01/2016  Indication:  Major Depressive Disorder   traZODone 50 MG tablet Commonly known as:  DESYREL Take 1 tablet (50 mg total) by mouth at bedtime as needed for sleep.  Indication:  Trouble Sleeping      Follow-up Information    [facility] Follow up on 05/01/2016.   Why:  Assessment for possible admission on Monday Dec. 18th at 7:45am. Bring [place] ID, social security card, change of clothing, and a 30 day supply of medications.  Contact information: [address] [place] [address] [phone]        [facility].   Specialty:  Behavioral Health Why:  If interested in outpatient services, please go to  walk-in clinic Monday-Friday at 8am for assessment for therapy and medication management.  Contact informationElpidio Eric: [address] [place] [address] [phone]           Follow-up recommendations:  Activity:  as tol Diet:  as tol  Comments:  1.  Take all your medications as prescribed.   2.  Report any adverse side effects to outpatient provider. 3.  Patient instructed to not use alcohol or illegal drugs while on prescription medicines. 4.  In the event of worsening symptoms, instructed patient to call 911, the crisis hotline or go to nearest emergency room for evaluation of symptoms.  Signed: Lindwood QuaSheila May Ashlley Booher, NP [facility] 04/30/2016, 1:25 PM

## 2016-05-01 NOTE — Progress Notes (Signed)
Discharge note:  Pt given all print outs and prescriptions as well as information on all medications.  Pt denied suicidal ideation.  Pt given taxi voucher and all belongings collected from locker.  Pt denied questions at this time.  Pt escorted to taxi by RN and all belongings and printouts placed in taxi with patient.  Voucher given to driver.

## 2016-05-22 ENCOUNTER — Emergency Department (HOSPITAL_COMMUNITY)
Admission: EM | Admit: 2016-05-22 | Discharge: 2016-05-22 | Disposition: A | Discharge status: Home / Self Care | Payer: Self-pay | Attending: Dermatology | Admitting: Dermatology

## 2016-05-22 ENCOUNTER — Encounter (HOSPITAL_COMMUNITY): Payer: Self-pay | Admitting: Oncology

## 2016-05-22 DIAGNOSIS — Z5321 Procedure and treatment not carried out due to patient leaving prior to being seen by health care provider: Secondary | ICD-10-CM | POA: Insufficient documentation

## 2016-05-22 DIAGNOSIS — R51 Headache: Secondary | ICD-10-CM | POA: Insufficient documentation

## 2016-05-22 DIAGNOSIS — Z79899 Other long term (current) drug therapy: Secondary | ICD-10-CM | POA: Insufficient documentation

## 2016-05-22 DIAGNOSIS — I10 Essential (primary) hypertension: Secondary | ICD-10-CM | POA: Insufficient documentation

## 2016-05-22 DIAGNOSIS — F17210 Nicotine dependence, cigarettes, uncomplicated: Secondary | ICD-10-CM | POA: Insufficient documentation

## 2016-05-22 NOTE — ED Notes (Signed)
Pt called for room x 3.  Not in lobby, bathroom or outside.  Will d/c [facility].

## 2016-05-22 NOTE — ED Triage Notes (Signed)
Pt checked in earlier and left.  Pt c/o HA since starting new medication.

## 2016-05-22 NOTE — ED Triage Notes (Signed)
Pt c/o HA since [facility]  (Norvasc, Vistaril and Zoloft)  Pt states he has had a HA all day.  Denies photophobia.  Rates pain 7/10.

## 2016-05-22 NOTE — ED Notes (Signed)
Per Registration, the Pt decided to leave.

## 2016-05-26 ENCOUNTER — Encounter (HOSPITAL_COMMUNITY): Payer: Self-pay

## 2016-05-26 ENCOUNTER — Emergency Department (HOSPITAL_COMMUNITY)
Admission: EM | Admit: 2016-05-26 | Discharge: 2016-05-27 | Disposition: A | Discharge status: Home / Self Care | Payer: Federal, State, Local not specified - Other

## 2016-05-26 DIAGNOSIS — F1024 Alcohol dependence with alcohol-induced mood disorder: Secondary | ICD-10-CM | POA: Diagnosis present

## 2016-05-26 DIAGNOSIS — M79605 Pain in left leg: Secondary | ICD-10-CM | POA: Insufficient documentation

## 2016-05-26 DIAGNOSIS — F329 Major depressive disorder, single episode, unspecified: Secondary | ICD-10-CM | POA: Insufficient documentation

## 2016-05-26 DIAGNOSIS — G8929 Other chronic pain: Secondary | ICD-10-CM | POA: Insufficient documentation

## 2016-05-26 DIAGNOSIS — Z79899 Other long term (current) drug therapy: Secondary | ICD-10-CM | POA: Insufficient documentation

## 2016-05-26 DIAGNOSIS — I10 Essential (primary) hypertension: Secondary | ICD-10-CM | POA: Insufficient documentation

## 2016-05-26 DIAGNOSIS — R45851 Suicidal ideations: Secondary | ICD-10-CM | POA: Insufficient documentation

## 2016-05-26 DIAGNOSIS — F17210 Nicotine dependence, cigarettes, uncomplicated: Secondary | ICD-10-CM | POA: Insufficient documentation

## 2016-05-26 DIAGNOSIS — M79604 Pain in right leg: Secondary | ICD-10-CM | POA: Insufficient documentation

## 2016-05-26 LAB — COMPREHENSIVE METABOLIC PANEL
ALT: 33 U/L (ref 17–63)
AST: 25 U/L (ref 15–41)
Albumin: 4.2 g/dL (ref 3.5–5.0)
Alkaline Phosphatase: 86 U/L (ref 38–126)
Anion gap: 11 (ref 5–15)
BUN: 10 mg/dL (ref 6–20)
CHLORIDE: 103 mmol/L (ref 101–111)
CO2: 25 mmol/L (ref 22–32)
CREATININE: 0.72 mg/dL (ref 0.61–1.24)
Calcium: 9.3 mg/dL (ref 8.9–10.3)
GFR calc Af Amer: >60 mL/min (ref >60)
GFR calc non Af Amer: >60 mL/min (ref >60)
Glucose, Bld: 113 mg/dL — ABNORMAL HIGH (ref 65–99)
POTASSIUM: 3.6 mmol/L (ref 3.5–5.1)
SODIUM: 139 mmol/L (ref 135–145)
Total Bilirubin: 1.7 mg/dL — ABNORMAL HIGH (ref 0.3–1.2)
Total Protein: 6.7 g/dL (ref 6.5–8.1)

## 2016-05-26 LAB — CBC
HCT: 41.7 % (ref 39.0–52.0)
HEMOGLOBIN: 14.8 g/dL (ref 13.0–17.0)
MCH: 29.4 pg (ref 26.0–34.0)
MCHC: 35.5 g/dL (ref 30.0–36.0)
MCV: 82.9 fL (ref 78.0–100.0)
Platelets: 166 10*3/uL (ref 150–400)
RBC: 5.03 MIL/uL (ref 4.22–5.81)
RDW: 13.2 % (ref 11.5–15.5)
WBC: 11.7 10*3/uL — AB (ref 4.0–10.5)

## 2016-05-26 LAB — RAPID URINE DRUG SCREEN, HOSP PERFORMED
AMPHETAMINES: NOT DETECTED
BENZODIAZEPINES: NOT DETECTED
Barbiturates: NOT DETECTED
Cocaine: NOT DETECTED
OPIATES: NOT DETECTED
TETRAHYDROCANNABINOL: NOT DETECTED

## 2016-05-26 LAB — SALICYLATE LEVEL

## 2016-05-26 LAB — ETHANOL: Alcohol, Ethyl (B): <5 mg/dL (ref <5)

## 2016-05-26 LAB — ACETAMINOPHEN LEVEL: Acetaminophen (Tylenol), Serum: <10 ug/mL — ABNORMAL LOW (ref 10–30)

## 2016-05-26 MED ORDER — IBUPROFEN 400 MG PO TABS
600.0000 mg | ORAL_TABLET | Freq: Once | ORAL | Status: AC
  Administered 2016-05-26: 600 mg via ORAL
  Filled 2016-05-26: qty 1

## 2016-05-26 MED ORDER — ACETAMINOPHEN 325 MG PO TABS: 325.0000 mg | ORAL_TABLET | Freq: Once | ORAL | Status: DC

## 2016-05-26 MED ORDER — ACETAMINOPHEN 500 MG PO TABS
ORAL_TABLET | ORAL | Status: AC
  Filled 2016-05-26: qty 2

## 2016-05-26 MED ORDER — AMLODIPINE BESYLATE 5 MG PO TABS
10.0000 mg | ORAL_TABLET | Freq: Every day | ORAL | Status: DC
  Administered 2016-05-26 – 2016-05-27 (×2): 10 mg via ORAL
  Filled 2016-05-26 (×2): qty 2

## 2016-05-26 MED ORDER — LORAZEPAM 1 MG PO TABS: 1.0000 mg | ORAL_TABLET | Freq: Three times a day (TID) | ORAL | Status: DC | PRN

## 2016-05-26 MED ORDER — SERTRALINE HCL 50 MG PO TABS
50.0000 mg | ORAL_TABLET | Freq: Every day | ORAL | Status: DC
  Administered 2016-05-26 – 2016-05-27 (×2): 50 mg via ORAL
  Filled 2016-05-26 (×2): qty 1

## 2016-05-26 MED ORDER — ONDANSETRON HCL 4 MG PO TABS: 4.0000 mg | ORAL_TABLET | Freq: Three times a day (TID) | ORAL | Status: DC | PRN

## 2016-05-26 MED ORDER — NICOTINE 21 MG/24HR TD PT24: 21.0000 mg | MEDICATED_PATCH | Freq: Every day | TRANSDERMAL | Status: DC | PRN

## 2016-05-26 MED ORDER — TRAZODONE HCL 50 MG PO TABS: 50.0000 mg | ORAL_TABLET | Freq: Every evening | ORAL | Status: DC | PRN

## 2016-05-26 MED ORDER — ACETAMINOPHEN 500 MG PO TABS
1000.0000 mg | ORAL_TABLET | Freq: Once | ORAL | Status: AC
  Administered 2016-05-26: 1000 mg via ORAL

## 2016-05-26 MED ORDER — HYDROXYZINE HCL 25 MG PO TABS: 25.0000 mg | ORAL_TABLET | Freq: Three times a day (TID) | ORAL | Status: DC | PRN

## 2016-05-26 MED ORDER — ALUM & MAG HYDROXIDE-SIMETH 200-200-20 MG/5ML PO SUSP: 30.0000 mL | ORAL | Status: DC | PRN

## 2016-05-26 MED ORDER — ACETAMINOPHEN 325 MG PO TABS
650.0000 mg | ORAL_TABLET | ORAL | Status: DC | PRN
  Administered 2016-05-26 – 2016-05-27 (×2): 650 mg via ORAL
  Filled 2016-05-26 (×2): qty 2

## 2016-05-26 NOTE — ED Triage Notes (Signed)
TTS done 

## 2016-05-26 NOTE — ED Triage Notes (Signed)
Pt complaining of bilateral ankle and leg pain. Pt denies any chest pain or SOB. Pt also comlpaining of lower back pain. Pt also complaining of SI. Denies any real plan. Pt denies any drugs or ETOH at triage. Pt a/o x 4, NAD.
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
## 2016-05-26 NOTE — ED Provider Notes (Signed)
[facility]-EMERGENCY DEPT Provider Note   CSN: [id number] Arrival date & time: 05/26/16  0132     History   Chief Complaint Chief Complaint  Patient presents with  . Leg Pain  . Back Pain  . Suicidal    HPI Ralph Anderson is a 46 y.o. male.  Patient with history of alcohol use, depression -- presents with complaint of bilateral lower extremity pain, foot pain, and lower back pain. Also endorses intermittent suicidal ideations. Patient describes an aching pain in both legs. He has had intermittent swelling of his lower extremities. No fevers, redness. Patient denies injuries to the area. States that he walks a lot and he was walking in the rain yesterday, which seemed to make his symptoms worse. Pain is less intense in his lower back. No treatments for these prior to arrival. No CP or SOB. No h/o heart failure, DVT. Patient denies warning symptoms of back pain including: fecal incontinence, urinary retention or overflow incontinence, night sweats, waking from sleep with back pain, unexplained fevers or weight loss, h/o cancer, IVDU, recent trauma.   Patient also complains of transient SI. States was suicidal upon arrival to ED but is feeling somewhat better now. States 3 days ago he got into an argument with his girlfriend and walked into traffic without thinking, however the cars stopped. He wishes to talk with someone about his SI today.        Past Medical History:  Diagnosis Date  . Hypertension     Patient Active Problem List   Diagnosis Date Noted  . Alcohol use disorder, severe, dependence (HCC) 04/30/2016  . MDD (major depressive disorder), recurrent severe, without psychosis (HCC) 04/25/2016    History reviewed. No pertinent surgical history.     Home Medications    Prior to Admission medications   Medication Sig Start Date End Date Taking? Authorizing Provider  amLODipine (NORVASC) 10 MG tablet Take 1 tablet (10 mg total) by mouth daily. 05/01/16   Adonis BrookSheila Agustin, NP   hydrOXYzine (ATARAX/VISTARIL) 25 MG tablet Take 1 tablet (25 mg total) by mouth 3 (three) times daily as needed for anxiety. 04/30/16   Adonis BrookSheila Agustin, NP  nicotine polacrilex (NICORETTE) 2 MG gum Take 1 each (2 mg total) by mouth as needed for smoking cessation. 04/30/16   Adonis BrookSheila Agustin, NP  sertraline (ZOLOFT) 50 MG tablet Take 1 tablet (50 mg total) by mouth daily. 05/01/16   Adonis BrookSheila Agustin, NP  traZODone (DESYREL) 50 MG tablet Take 1 tablet (50 mg total) by mouth at bedtime as needed for sleep. 04/30/16   Adonis BrookSheila Agustin, NP    Family History History reviewed. No pertinent family history.  Social History Social History  Substance Use Topics  . Smoking status: Current Every Day Smoker    Packs/day: 1.00    Types: Cigarettes  . Smokeless tobacco: Never Used  . Alcohol use 14.4 oz/week    24 Cans of beer per week     Allergies   Patient has no known allergies.   Review of Systems Review of Systems  Constitutional: Negative for diaphoresis and fever.  HENT: Negative for rhinorrhea and sore throat.   Eyes: Negative for redness.  Respiratory: Negative for cough and shortness of breath.   Cardiovascular: Positive for leg swelling. Negative for chest pain and palpitations.  Gastrointestinal: Negative for abdominal pain, diarrhea, nausea and vomiting.  Genitourinary: Negative for dysuria.  Musculoskeletal: Positive for arthralgias and myalgias. Negative for back pain and neck pain.  Skin: Negative for rash.  Neurological: Negative for syncope, light-headedness and headaches.  Psychiatric/Behavioral: Positive for suicidal ideas. Negative for self-injury. The patient is not nervous/anxious.      Physical Exam Updated Vital Signs BP 149/92   Pulse 90   Temp 97.7 F (36.5 C)   Resp 18   SpO2 99%   Physical Exam  Constitutional: He appears well-developed and well-nourished.  HENT:  Head: Normocephalic and atraumatic.  Mouth/Throat: Oropharynx is clear and moist.  Eyes:  Conjunctivae are normal. Right eye exhibits no discharge. Left eye exhibits no discharge.  Neck: Normal range of motion. Neck supple. No JVD present.  Cardiovascular: Normal rate, regular rhythm and normal heart sounds.   No murmur heard. Pulmonary/Chest: Effort normal and breath sounds normal. No respiratory distress. He has no wheezes. He has no rales.  Abdominal: Soft. There is no tenderness. There is no guarding.  Rash over lower abd pt states is from a nickel allergy.   Musculoskeletal:  Trace edema in ankles and feet. Generalized tenderness, most pronounced over the soles of the feet. No erythema or signs of infection, outside of some tinea pedis between all toes. No unilateral calf swelling or tenderness. Appearance not consistent with DVT.  Neurological: He is alert.  Skin: Skin is warm and dry.  Psychiatric: He has a normal mood and affect. He expresses suicidal ideation. He expresses no homicidal ideation. He expresses suicidal plans. He expresses no homicidal plans.  Nursing note and vitals reviewed.    ED Treatments / Results  Labs (all labs ordered are listed, but only abnormal results are displayed) Labs Reviewed  COMPREHENSIVE METABOLIC PANEL - Abnormal; Notable for the following:       Result Value   Glucose, Bld 113 (*)    Total Bilirubin 1.7 (*)    All other components within normal limits  ACETAMINOPHEN LEVEL - Abnormal; Notable for the following:    Acetaminophen (Tylenol), Serum <10 (*)    All other components within normal limits  CBC - Abnormal; Notable for the following:    WBC 11.7 (*)    All other components within normal limits  ETHANOL  SALICYLATE LEVEL  RAPID URINE DRUG SCREEN, HOSP PERFORMED    Procedures Procedures (including critical care time)  Medications Ordered in ED Medications  acetaminophen (TYLENOL) 500 MG tablet (not administered)  acetaminophen (TYLENOL) tablet 1,000 mg (1,000 mg Oral Given 05/26/16 0432)     Initial Impression /  Assessment and Plan / ED Course  I have reviewed the triage vital signs and the nursing notes.  Pertinent labs & imaging results that were available during my care of the patient were reviewed by me and considered in my medical decision making (see chart for details).  Clinical Course    Patient seen and examined. Work-up initiated. Medications ordered.   Patient is medically cleared.   Vital signs reviewed and are as follows: BP 116/74 (BP Location: Right Arm)   Pulse 84   Temp 97.7 F (36.5 C)   Resp 16   SpO2 99%   4:07 PM TTS consult completed, patient unwilling to contract for safety. Will reassess in AM.   Final Clinical Impressions(s) / ED Diagnoses   Final diagnoses:  Suicidal ideations  Chronic pain of lower extremity, bilateral    New Prescriptions New Prescriptions   No medications on file     Renne Crigler, PA-C 05/26/16 1608    Tomasita Crumble, MD 05/28/16 2211

## 2016-05-26 NOTE — ED Notes (Signed)
RN at bedside sitting with pt. , alert , calm / cooperative , respirations unlabored.

## 2016-05-26 NOTE — ED Notes (Signed)
Pt provided with meal tray.

## 2016-05-27 DIAGNOSIS — F332 Major depressive disorder, recurrent severe without psychotic features: Secondary | ICD-10-CM

## 2016-05-27 DIAGNOSIS — F1020 Alcohol dependence, uncomplicated: Secondary | ICD-10-CM

## 2016-05-27 DIAGNOSIS — R45851 Suicidal ideations: Secondary | ICD-10-CM | POA: Insufficient documentation

## 2016-05-27 DIAGNOSIS — M79604 Pain in right leg: Secondary | ICD-10-CM | POA: Diagnosis not present

## 2016-05-27 DIAGNOSIS — M79605 Pain in left leg: Secondary | ICD-10-CM | POA: Diagnosis not present

## 2016-05-27 DIAGNOSIS — F17210 Nicotine dependence, cigarettes, uncomplicated: Secondary | ICD-10-CM

## 2016-05-27 DIAGNOSIS — Z79899 Other long term (current) drug therapy: Secondary | ICD-10-CM

## 2016-05-27 DIAGNOSIS — G8929 Other chronic pain: Secondary | ICD-10-CM | POA: Insufficient documentation

## 2016-05-27 MED ORDER — BUSPIRONE HCL 7.5 MG PO TABS
7.5000 mg | ORAL_TABLET | Freq: Two times a day (BID) | ORAL | 0 refills | Status: DC
Start: 2016-05-27 — End: 2016-06-03

## 2016-05-27 NOTE — ED Notes (Signed)
Telepsych being performed. 

## 2016-05-27 NOTE — ED Notes (Signed)
Breakfast ordered 

## 2016-05-27 NOTE — Discharge Instructions (Signed)
Return for worsening symptoms, including feeling unsafe, thoughts of wanting to hurt self, or any other symptoms concerning to you.

## 2016-05-27 NOTE — Progress Notes (Signed)
Patient has been recommended discharge, per FNP Claudette Headonrad Withrow.  [facility] RN Kriste BasqueBecky has been informed.  [place] Abtsatia Leda [place], LCSWA Disposition staff 05/27/2016 11:59 AM

## 2016-05-27 NOTE — ED Notes (Signed)
Pt and male friend state his lower legs/ankles appear to be less swollen this am.

## 2016-05-27 NOTE — ED Notes (Signed)
Pt's friend leaving at this time. States she will call RN around 1100 to check on tx plan.

## 2016-05-27 NOTE — ED Notes (Signed)
Patient was given a snack and drink. A regular Diet was ordered for Lunch.

## 2016-05-27 NOTE — ED Notes (Signed)
[facility] per Dahlia ClientHannah, SW, [facility]). Pt states he will be able to get filled.

## 2016-05-27 NOTE — Consult Note (Signed)
[facility] TelePsychiatry Consult   Reason for Consult:  Alcohol abuse with suicidal ideation Referring Physician:  EDP Patient Identification: Ralph Anderson MRN:  [id number] Principal Diagnosis: Alcohol use disorder, severe, dependence ([place]) Diagnosis:   Patient Active Problem List   Diagnosis Date Noted  . Alcohol use disorder, severe, dependence ([place]) [F10.20] 04/30/2016    Priority: High  . Chronic pain of lower extremity, bilateral [M79.604, M79.605, G89.29]   . Suicidal ideations [R45.851]   . MDD (major depressive disorder), recurrent severe, without psychosis ([place]) [F33.2] 04/25/2016    Total Time spent with patient: 30 minutes  Subjective:   Ralph Anderson is a 46 y.o. male patient admitted with reports of suicidal thoughts and frustration with being homeless and having arguments with his girlfriend. Pt seen and chart reviewed with girlfriend present. Pt is alert/oriented x4, calm, cooperative, and appropriate to situation. Pt denies suicidal/homicidal ideation and psychosis and does not appear to be responding to internal stimuli. Pt reports that he would like help with medications and also would like help with housing, but then reports that he can "sometimes stay with his girlfriend."   HPI: I have reviewed and concur with HPI elements below, modified as follows: "Ralph Anderson is a 46 y.o. male who presented voluntarily to [facility] with complaint of suicidal ideation (as well as physical complaint related to legs and feet -- Pt is ambulatory).  Pt was last assessed by TTS in December 2017 at which time he was treated inpatient at [facility].  Pt now returns with complaint of suicidal ideation and other depressive symptoms.  Pt reported as follows:  For about a week, he has experienced increased suicidal ideation without specific plan.   or intent.  Conflict with girlfriend was the identified trigger.  Pt also endorsed persistent and unremitting sadness, insomnia, irritability, feelings of  hopelessness/worthlessness.  Pt also endorsed the stressor of homelessness.  Pt also endorsed a history of alcohol use disorder, although he denied recent use (BAC was clear).  Pt indicated that he is taking the medication prescribed during his last stay at [facility].  Pt denied having significant family supports, and he indicated that he did not want to plan for safety, expressing concern about his continuing suicidal ideation.  During assessment, Pt was alert and oriented.  He had good eye contact and was cooperative.  Pt was dressed in scrubs and appeared appropriately groomed.  Pt's demeanor was calm.  Pt endorsed suicidal ideation without plan or intent and other depressive symptoms (see above).  Pt endorsed hitting himself when frustrated and sad.  Pt denied homicidal ideation, auditory/visual hallucination, and current substance use.  Pt's speech was normal in rate, rhythm, and volume.  Thought processes were within normal range, and thought content was goal-oriented and logical.  There was no evidence of delusion.  Memory and concentration were grossly intact.  Pt's insight, judgment, and impulse control were deemed fair."  Today on 05/27/2016 pt seen and chart reviewed as above. Pt is known to me and other providers here. He is known to hav ea high degree of potential secondary gain though admission due to being homeless.   Past Psychiatric History: depression, alcohol abuse  Risk to Self: Suicidal Ideation: Yes-Currently Present Suicidal Intent: No Is patient at risk for suicide?: Yes Suicidal Plan?: No Access to Means: Yes Specify Access to Suicidal Means: Past plan -- walking into traffic What has been your use of drugs/alcohol within the last 12 months?: Alcohol Triggers for Past Attempts: Other (Comment) (Conflict with  girlfriend triggers ideation) Intentional Self Injurious Behavior: Damaging (Pt stated he hits himself) Comment - Self Injurious Behavior: Hits self when frustrated Risk to  Others: Homicidal Ideation: No Thoughts of Harm to Others: No Current Homicidal Intent: No Current Homicidal Plan: No Access to Homicidal Means: No History of harm to others?: No Assessment of Violence: None Noted Does patient have access to weapons?: No Criminal Charges Pending?: No Describe Pending Criminal Charges: DWI, reckless driving, fail to comply lic restrictions Does patient have a court date: Yes Court Date: 06/21/16 Prior Inpatient Therapy: Prior Inpatient Therapy: Yes Prior Therapy Dates: December 2017 Prior Therapy Facilty/Provider(s): [facility] Reason for Treatment: SI Prior Outpatient Therapy: Prior Outpatient Therapy: No  Past Medical History:  Past Medical History:  Diagnosis Date  . Hypertension    History reviewed. No pertinent surgical history. Family History: History reviewed. No pertinent family history. Family Psychiatric  History:  none Social History:  History  Alcohol Use  . 14.4 oz/week  . 24 Cans of beer per week    Comment: BAC was clear on admission     History  Drug Use No    Comment: Pt stated "it's been 10 yrs since I smoked marijuana"    Social History   Social History  . Marital status: Single    Spouse name: N/A  . Number of children: N/A  . Years of education: N/A   Social History Main Topics  . Smoking status: Current Every Day Smoker    Packs/day: 1.00    Types: Cigarettes  . Smokeless tobacco: Never Used  . Alcohol use 14.4 oz/week    24 Cans of beer per week     Comment: BAC was clear on admission  . Drug use: No     Comment: Pt stated "it's been 10 yrs since I smoked marijuana"  . Sexual activity: Yes   Other Topics Concern  . None   Social History Narrative  . None   Additional Social History:    Allergies:  No Known Allergies  Labs:  Results for orders placed or performed during the hospital encounter of 05/26/16 (from the past 48 hour(s))  Comprehensive metabolic panel     Status: Abnormal   Collection Time:  05/26/16  2:22 AM  Result Value Ref Range   Sodium 139 135 - 145 mmol/L   Potassium 3.6 3.5 - 5.1 mmol/L   Chloride 103 101 - 111 mmol/L   CO2 25 22 - 32 mmol/L   Glucose, Bld 113 (H) 65 - 99 mg/dL   BUN 10 6 - 20 mg/dL   Creatinine, Ser 0.72 0.61 - 1.24 mg/dL   Calcium 9.3 8.9 - 10.3 mg/dL   Total Protein 6.7 6.5 - 8.1 g/dL   Albumin 4.2 3.5 - 5.0 g/dL   AST 25 15 - 41 U/L   ALT 33 17 - 63 U/L   Alkaline Phosphatase 86 38 - 126 U/L   Total Bilirubin 1.7 (H) 0.3 - 1.2 mg/dL   GFR calc non Af Amer >60 >60 mL/min   GFR calc Af Amer >60 >60 mL/min    Comment: (NOTE) The eGFR has been calculated using the CKD EPI equation. This calculation has not been validated in all clinical situations. eGFR's persistently <60 mL/min signify possible Chronic Kidney Disease.    Anion gap 11 5 - 15  cbc     Status: Abnormal   Collection Time: 05/26/16  2:22 AM  Result Value Ref Range   WBC 11.7 (H) 4.0 -  10.5 K/uL   RBC 5.03 4.22 - 5.81 MIL/uL   Hemoglobin 14.8 13.0 - 17.0 g/dL   HCT 41.7 39.0 - 52.0 %   MCV 82.9 78.0 - 100.0 fL   MCH 29.4 26.0 - 34.0 pg   MCHC 35.5 30.0 - 36.0 g/dL   RDW 13.2 11.5 - 15.5 %   Platelets 166 150 - 400 K/uL  Ethanol     Status: None   Collection Time: 05/26/16  2:23 AM  Result Value Ref Range   Alcohol, Ethyl (B) <5 <5 mg/dL    Comment:        LOWEST DETECTABLE LIMIT FOR SERUM ALCOHOL IS 5 mg/dL FOR MEDICAL PURPOSES ONLY   Salicylate level     Status: None   Collection Time: 05/26/16  2:23 AM  Result Value Ref Range   Salicylate Lvl <3.7 2.8 - 30.0 mg/dL  Acetaminophen level     Status: Abnormal   Collection Time: 05/26/16  2:23 AM  Result Value Ref Range   Acetaminophen (Tylenol), Serum <10 (L) 10 - 30 ug/mL    Comment:        THERAPEUTIC CONCENTRATIONS VARY SIGNIFICANTLY. A RANGE OF 10-30 ug/mL MAY BE AN EFFECTIVE CONCENTRATION FOR MANY PATIENTS. HOWEVER, SOME ARE BEST TREATED AT CONCENTRATIONS OUTSIDE THIS RANGE. ACETAMINOPHEN  CONCENTRATIONS >150 ug/mL AT 4 HOURS AFTER INGESTION AND >50 ug/mL AT 12 HOURS AFTER INGESTION ARE OFTEN ASSOCIATED WITH TOXIC REACTIONS.   Rapid urine drug screen (hospital performed)     Status: None   Collection Time: 05/26/16  2:25 AM  Result Value Ref Range   Opiates NONE DETECTED NONE DETECTED   Cocaine NONE DETECTED NONE DETECTED   Benzodiazepines NONE DETECTED NONE DETECTED   Amphetamines NONE DETECTED NONE DETECTED   Tetrahydrocannabinol NONE DETECTED NONE DETECTED   Barbiturates NONE DETECTED NONE DETECTED    Comment:        DRUG SCREEN FOR MEDICAL PURPOSES ONLY.  IF CONFIRMATION IS NEEDED FOR ANY PURPOSE, NOTIFY LAB WITHIN 5 DAYS.        LOWEST DETECTABLE LIMITS FOR URINE DRUG SCREEN Drug Class       Cutoff (ng/mL) Amphetamine      1000 Barbiturate      200 Benzodiazepine   169 Tricyclics       678 Opiates          300 Cocaine          300 THC              50     No current facility-administered medications for this encounter.    Current Outpatient Prescriptions  Medication Sig Dispense Refill  . amLODipine (NORVASC) 10 MG tablet Take 1 tablet (10 mg total) by mouth daily. 30 tablet 0  . hydrOXYzine (ATARAX/VISTARIL) 25 MG tablet Take 1 tablet (25 mg total) by mouth 3 (three) times daily as needed for anxiety. 30 tablet 0  . sertraline (ZOLOFT) 50 MG tablet Take 1 tablet (50 mg total) by mouth daily. 30 tablet 0  . traZODone (DESYREL) 50 MG tablet Take 1 tablet (50 mg total) by mouth at bedtime as needed for sleep. 30 tablet 0  . busPIRone (BUSPAR) 7.5 MG tablet Take 1 tablet (7.5 mg total) by mouth 2 (two) times daily. 60 tablet 0  . nicotine polacrilex (NICORETTE) 2 MG gum Take 1 each (2 mg total) by mouth as needed for smoking cessation. (Patient not taking: Reported on 05/26/2016) 100 tablet 0    Musculoskeletal: Strength &  Muscle Tone: within normal limits Gait & Station: normal Patient leans: N/A  Psychiatric Specialty Exam: Physical Exam   Constitutional: He is oriented to person, place, and time. He appears well-developed and well-nourished.  HENT:  Head: Normocephalic.  Neck: Normal range of motion.  Respiratory: Effort normal.  Musculoskeletal: Normal range of motion.  Neurological: He is alert and oriented to person, place, and time.  Psychiatric: His speech is normal and behavior is normal. Judgment normal. Cognition and memory are normal. He exhibits a depressed mood. He expresses suicidal ideation. He expresses suicidal plans.    Review of Systems  Psychiatric/Behavioral: Positive for depression and substance abuse. Negative for hallucinations and suicidal ideas. The patient is nervous/anxious and has insomnia.   All other systems reviewed and are negative.   Blood pressure 113/64, pulse 72, temperature 98.4 F (36.9 C), temperature source Oral, resp. rate 20, SpO2 95 %.There is no height or weight on file to calculate BMI.  General Appearance: Casual and Fairly Groomed  Eye Contact:  Fair  Speech:  Normal Rate  Volume:  Decreased  Mood:  Depressed  Affect:  Congruent  Thought Process:  Coherent and Descriptions of Associations: Intact  Orientation:  Full (Time, Place, and Person)  Thought Content:  Focused on housing and med management  Suicidal Thoughts:  No, contracts for safety  Homicidal Thoughts:  No  Memory:  Immediate;   Fair Recent;   Fair Remote;   Fair  Judgement:  Poor  Insight:  Fair  Psychomotor Activity:  Normal  Concentration:  Concentration: Fair and Attention Span: Fair  Recall:  [employer] of Knowledge:  Fair  Language:  Fair  Akathisia:  No  Handed:  Right  AIMS (if indicated):     Assets:  Leisure Time Resilience  ADL's:  Intact  Cognition:  WNL  Sleep:        Treatment Plan Summary: Alcohol use disorder, severe, dependence ([place]) stable for outpatient management  Please give Rx for Buspar 7.20m po bid #60 (30 day supply), pt already has a script for his 585mzoloft.      Disposition: No evidence of imminent risk to self or others at present.   Patient does not meet criteria for psychiatric inpatient admission. Supportive therapy provided about ongoing stressors. Refer to IOP. Discussed crisis plan, support from social network, calling 911, coming to the Emergency Department, and calling Suicide Hotline.  WiBenjamine MolaFNP 05/27/2016 6:00 PM  Agree with NP assessment

## 2016-05-27 NOTE — ED Notes (Signed)
Staff encouraging pt to wear blue scrubs rather than putting back his soiled clothing. Pt declining. Pt's girlfriend, Boyd Kerbsenny, attempting to encourage also.

## 2016-05-27 NOTE — Progress Notes (Signed)
LCSW received call from RN with regards to medication assistance. Patient  Is to be discharged home and needing help with ability to fill medication. Patient has been recommended [facility] Patient cannot be matched due to controlled substance and even if he was matched by CM dept, the medications would be 3 dollars.  LCSW encouraged [facility] or [facility], can be seen in walk in clinic for medication and [facility] as he does not have insurance.  Information given to RN who will discharge patient from ED. Patient aware of crisis services if needs arise.  Deretha EmoryHannah Maimouna Rondeau LCSW, MSW Clinical Social Work: Optician, dispensingystem [place]

## 2016-05-31 ENCOUNTER — Encounter (HOSPITAL_COMMUNITY): Payer: Self-pay | Admitting: *Deleted

## 2016-05-31 ENCOUNTER — Emergency Department (HOSPITAL_COMMUNITY)
Admission: EM | Admit: 2016-05-31 | Discharge: 2016-06-01 | Disposition: A | Discharge status: Home / Self Care | Payer: Federal, State, Local not specified - Other | Attending: Emergency Medicine | Admitting: Emergency Medicine

## 2016-05-31 DIAGNOSIS — I10 Essential (primary) hypertension: Secondary | ICD-10-CM | POA: Insufficient documentation

## 2016-05-31 DIAGNOSIS — Z79899 Other long term (current) drug therapy: Secondary | ICD-10-CM | POA: Insufficient documentation

## 2016-05-31 DIAGNOSIS — F17210 Nicotine dependence, cigarettes, uncomplicated: Secondary | ICD-10-CM | POA: Insufficient documentation

## 2016-05-31 DIAGNOSIS — R45851 Suicidal ideations: Secondary | ICD-10-CM

## 2016-05-31 DIAGNOSIS — F322 Major depressive disorder, single episode, severe without psychotic features: Secondary | ICD-10-CM | POA: Insufficient documentation

## 2016-05-31 DIAGNOSIS — F1024 Alcohol dependence with alcohol-induced mood disorder: Secondary | ICD-10-CM | POA: Diagnosis present

## 2016-05-31 LAB — COMPREHENSIVE METABOLIC PANEL
ALBUMIN: 4.4 g/dL (ref 3.5–5.0)
ALT: 31 U/L (ref 17–63)
ANION GAP: 15 (ref 5–15)
AST: 27 U/L (ref 15–41)
Alkaline Phosphatase: 99 U/L (ref 38–126)
BUN: 11 mg/dL (ref 6–20)
CHLORIDE: 102 mmol/L (ref 101–111)
CO2: 23 mmol/L (ref 22–32)
Calcium: 9.5 mg/dL (ref 8.9–10.3)
Creatinine, Ser: 0.6 mg/dL — ABNORMAL LOW (ref 0.61–1.24)
GFR calc Af Amer: >60 mL/min (ref >60)
GFR calc non Af Amer: >60 mL/min (ref >60)
GLUCOSE: 84 mg/dL (ref 65–99)
POTASSIUM: 3.7 mmol/L (ref 3.5–5.1)
Sodium: 140 mmol/L (ref 135–145)
TOTAL PROTEIN: 7.2 g/dL (ref 6.5–8.1)
Total Bilirubin: 1.3 mg/dL — ABNORMAL HIGH (ref 0.3–1.2)

## 2016-05-31 LAB — RAPID URINE DRUG SCREEN, HOSP PERFORMED
AMPHETAMINES: NOT DETECTED
BARBITURATES: NOT DETECTED
Benzodiazepines: NOT DETECTED
COCAINE: NOT DETECTED
OPIATES: NOT DETECTED
TETRAHYDROCANNABINOL: NOT DETECTED

## 2016-05-31 LAB — CBC
HEMATOCRIT: 44.7 % (ref 39.0–52.0)
HEMOGLOBIN: 16 g/dL (ref 13.0–17.0)
MCH: 30 pg (ref 26.0–34.0)
MCHC: 35.8 g/dL (ref 30.0–36.0)
MCV: 83.9 fL (ref 78.0–100.0)
Platelets: 185 10*3/uL (ref 150–400)
RBC: 5.33 MIL/uL (ref 4.22–5.81)
RDW: 14 % (ref 11.5–15.5)
WBC: 12.5 10*3/uL — ABNORMAL HIGH (ref 4.0–10.5)

## 2016-05-31 LAB — ETHANOL: ALCOHOL ETHYL (B): 41 mg/dL — AB (ref <5)

## 2016-05-31 MED ORDER — TRAZODONE HCL 50 MG PO TABS: 50.0000 mg | ORAL_TABLET | Freq: Every evening | ORAL | Status: DC | PRN

## 2016-05-31 MED ORDER — BUSPIRONE HCL 15 MG PO TABS
7.5000 mg | ORAL_TABLET | Freq: Two times a day (BID) | ORAL | Status: DC
  Administered 2016-05-31 – 2016-06-01 (×2): 7.5 mg via ORAL
  Filled 2016-05-31 (×3): qty 1

## 2016-05-31 MED ORDER — SERTRALINE HCL 50 MG PO TABS
50.0000 mg | ORAL_TABLET | Freq: Every day | ORAL | Status: DC
  Administered 2016-06-01: 50 mg via ORAL
  Filled 2016-05-31 (×2): qty 1

## 2016-05-31 MED ORDER — AMLODIPINE BESYLATE 5 MG PO TABS
10.0000 mg | ORAL_TABLET | Freq: Every day | ORAL | Status: DC
  Administered 2016-05-31 – 2016-06-01 (×2): 10 mg via ORAL
  Filled 2016-05-31 (×2): qty 2

## 2016-05-31 MED ORDER — HYDROXYZINE HCL 25 MG PO TABS: 25.0000 mg | ORAL_TABLET | Freq: Three times a day (TID) | ORAL | Status: DC | PRN

## 2016-05-31 MED ORDER — NICOTINE POLACRILEX 2 MG MT GUM
2.0000 mg | CHEWING_GUM | OROMUCOSAL | Status: DC | PRN
  Filled 2016-05-31: qty 1

## 2016-05-31 NOTE — BH Assessment (Addendum)
Tele Assessment Note   Ralph Anderson is an 46 y.o. male.  -Clinician reviewed note from Dr. Melene Plan.  46 yo M with a chief complaint of suicidal ideation. Patient was just discharged from behavioral health with the same. He feels that he has become suicidal again. States that he was at a shelter where he took a bath and became angry and the people that were there. He felt the need to take his life. Denies prior suicidal attempts. Is unsure of the current plan. Denies illegal drug use. States that he has been compliant with his medications except for today.  Patient says he has thoughts of killing himself.  Patient says "I just get this feeling like I should die and not be on this earth anymore."  Patient says he has thoughts of stepping in front of a car to die.  Patient denies any HI or A/V hallucinations.  Patient says he was recently discharged from [facility] for SA issues.  He said that he has been trying not to drink as much.  Patient says he used to drink daily.  He now says he drinks less than daily.  He has consumed 3 beers today.  Patient is currently unable to contract for safety at this time.  Patient has no current outpatient resources.  Patient was at [facility] in December 2017.  -Clinician discussed patient care with Donell Sievert, PA  Diagnosis: MDD recurrent, severe; ETOH use d/o moderate  Past Medical History:  Past Medical History:  Diagnosis Date  . Hypertension     History reviewed. No pertinent surgical history.  Family History: No family history on file.  Social History:  reports that he has been smoking Cigarettes.  He has been smoking about 1.00 pack per day. He has never used smokeless tobacco. He reports that he drinks about 14.4 oz of alcohol per week . He reports that he does not use drugs.  Additional Social History:  Alcohol / Drug Use Pain Medications: See PTA medication list Prescriptions: See PTA medication list Over the Counter: See PTA medication  list History of alcohol / drug use?: Yes Substance #1 Name of Substance 1: ETOH (beer) 1 - Age of First Use: 46 years of age 14 - Amount (size/oz): Up to a 12 pack 1 - Frequency: Not drinking every day 1 - Duration: Patient has been drinking off and on over the years. 1 - Last Use / Amount: 01/17 drank 3 beers  CIWA: CIWA-Ar BP: 145/83 Pulse Rate: 94 COWS:    PATIENT STRENGTHS: (choose at least two) Ability for insight Average or above average intelligence Capable of independent living Communication skills  Allergies: No Known Allergies  Home Medications:  (Not in a hospital admission)  OB/GYN Status:  No LMP for male patient.  General Assessment Data Location of Assessment: [facility] ED TTS Assessment: In system Is this a Tele or Face-to-Face Assessment?: Tele Assessment Is this an Initial Assessment or a Re-assessment for this encounter?: Initial Assessment Marital status: Single Is patient pregnant?: No Pregnancy Status: No Living Arrangements: Other (Comment) (Pt says he is homeless now.) Can pt return to current living arrangement?: Yes Admission Status: Voluntary Is patient capable of signing voluntary admission?: Yes Referral Source: Self/Family/Friend Insurance type: self pay     Crisis Care Plan Living Arrangements: Other (Comment) (Pt says he is homeless now.) Name of Psychiatrist: None Name of Therapist: None  Education Status Is patient currently in school?: No Highest grade of school patient has completed: 12th  grade  Risk to self with the past 6 months Suicidal Ideation: Yes-Currently Present Has patient been a risk to self within the past 6 months prior to admission? : Yes Suicidal Intent: Yes-Currently Present Has patient had any suicidal intent within the past 6 months prior to admission? : Yes Is patient at risk for suicide?: Yes Suicidal Plan?: Yes-Currently Present Has patient had any suicidal plan within the past 6 months prior to admission? :  Yes Specify Current Suicidal Plan: Stepping in front of a car Access to Means: Yes Specify Access to Suicidal Means: Traffic & cars What has been your use of drugs/alcohol within the last 12 months?: ETOH Previous Attempts/Gestures: No How many times?: 0 Other Self Harm Risks: Will hit himself in the head Triggers for Past Attempts: Unpredictable ("I get aggrevated and just can't stand it") Intentional Self Injurious Behavior: Damaging (Hitting himself) Comment - Self Injurious Behavior: Hitting himself on the head Family Suicide History: No Recent stressful life event(s): Other (Comment) (Pt cannot identify a specific stressor.) Persecutory voices/beliefs?: Yes Depression: Yes Depression Symptoms: Despondent, Isolating, Loss of interest in usual pleasures, Feeling worthless/self pity Substance abuse history and/or treatment for substance abuse?: Yes Suicide prevention information given to non-admitted patients: Not applicable  Risk to Others within the past 6 months Homicidal Ideation: No Does patient have any lifetime risk of violence toward others beyond the six months prior to admission? : No Thoughts of Harm to Others: No Current Homicidal Intent: No Current Homicidal Plan: No Access to Homicidal Means: No Identified Victim: No one History of harm to others?: No Assessment of Violence: None Noted Violent Behavior Description: N/A Does patient have access to weapons?: No Criminal Charges Pending?: Yes Describe Pending Criminal Charges: DUI Does patient have a court date: Yes Court Date: 06/21/16 Is patient on probation?: Yes (Unsupervised probation.)  Psychosis Hallucinations: None noted Delusions: None noted  Mental Status Report Appearance/Hygiene: Unremarkable, In scrubs Eye Contact: Good Motor Activity: Freedom of movement, Unremarkable Speech: Logical/coherent Level of Consciousness: Alert Mood: Depressed, Sad Affect: Sad Anxiety Level: Minimal Thought  Processes: Coherent, Relevant Judgement: Unimpaired Orientation: Person, Place, Situation, Time Obsessive Compulsive Thoughts/Behaviors: None  Cognitive Functioning Concentration: Normal Memory: Recent Intact, Remote Intact IQ: Average Insight: Good Impulse Control: Poor Appetite: Good Weight Loss: 0 Weight Gain: 0 Sleep: No Change Total Hours of Sleep: 6 Vegetative Symptoms: None  ADLScreening [facility] Assessment Services) Patient's cognitive ability adequate to safely complete daily activities?: Yes Patient able to express need for assistance with ADLs?: Yes Independently performs ADLs?: Yes (appropriate for developmental age)  Prior Inpatient Therapy Prior Inpatient Therapy: Yes Prior Therapy Dates: December 2017 [facility] Reason for Treatment: SI  Prior Outpatient Therapy Prior Outpatient Therapy: No Prior Therapy Dates: N/A Prior Therapy Facilty/Provider(s): NA Reason for Treatment: N/A Does patient have an ACCT team?: No Does patient have Intensive In-House Services?  : No Does patient have [facility] services? : No Does patient have P4CC services?: No  ADL Screening (condition at time of admission) Patient's cognitive ability adequate to safely complete daily activities?: Yes Is the patient deaf or have difficulty hearing?: No Does the patient have difficulty seeing, even when wearing glasses/contacts?: Yes (Poor eyesight) Does the patient have difficulty concentrating, remembering, or making decisions?: No Patient able to express need for assistance with ADLs?: Yes Does the patient have difficulty dressing or bathing?: No Independently performs ADLs?: Yes (appropriate for developmental age) Does the patient have difficulty walking or climbing stairs?: No Weakness of Legs:  None Weakness of Arms/Hands: None       Abuse/Neglect Assessment (Assessment to be complete while patient is alone) Physical Abuse: Denies Verbal Abuse:  Denies Sexual Abuse: Denies Exploitation of patient/patient's resources: Denies Self-Neglect: Denies     Merchant navy officer (For Healthcare) Does Patient Have a Medical Advance Directive?: No    Additional Information 1:1 In Past 12 Months?: No CIRT Risk: No Elopement Risk: No Does patient have medical clearance?: Yes     Disposition:  Disposition Initial Assessment Completed for this Encounter: Yes Disposition of Patient: Other dispositions Other disposition(s): Other (Comment) (Pt to be reviewed with PA)  Beatriz Stallion Ray 05/31/2016 10:49 PM

## 2016-05-31 NOTE — ED Triage Notes (Signed)
Staffing office called  Chg notified of the sitter need  Ophelia CharterSitter will be here at 2300  The pt is here with his girlfriend who is checked in as suicidal also  Security is wanding the pt.  Poor hygiene   Dressed in burgundy scrubs belongings placed in bags

## 2016-05-31 NOTE — ED Provider Notes (Signed)
[facility]-EMER[facility]ENCY DEPT Provider Note   CSN: [id number] Arrival date & time: 05/31/16  2004     History   Chief Complaint Chief Complaint  Patient presents with  . Psychiatric Evaluation    HPI Ralph Anderson is a 46 y.o. male.  46 yo M with a chief complaint of suicidal ideation. Patient was just discharged from behavioral health with the same. He feels that he has become suicidal again. States that he was at a shelter where he took a bath and became angry and the people that were there. He felt the need to take his life. Denies prior suicidal attempts. Is unsure of the current plan. Denies illegal drug use. States that he has been compliant with his medications except for today.   The history is provided by the patient.  Mental Health Problem  Presenting symptoms: suicidal thoughts   Degree of incapacity (severity):  Moderate Onset quality:  Gradual Duration:  2 days Timing:  Constant Progression:  Worsening Chronicity:  New Treatment compliance:  Untreated Relieved by:  Nothing Worsened by:  Nothing Ineffective treatments:  None tried Associated symptoms: no abdominal pain, no chest pain and no headaches     Past Medical History:  Diagnosis Date  . Hypertension     Patient Active Problem List   Diagnosis Date Noted  . Chronic pain of lower extremity, bilateral   . Suicidal ideations   . Alcohol use disorder, severe, dependence (HCC) 04/30/2016  . MDD (major depressive disorder), recurrent severe, without psychosis (HCC) 04/25/2016    History reviewed. No pertinent surgical history.     Home Medications    Prior to Admission medications   Medication Sig Start Date End Date Taking? Authorizing Provider  amLODipine (NORVASC) 10 MG tablet Take 1 tablet (10 mg total) by mouth daily. 05/01/16  Yes Adonis Brook, NP  busPIRone (BUSPAR) 7.5 MG tablet Take 1 tablet (7.5 mg total) by mouth 2 (two) times daily. 05/27/16   Lavera Guise, MD  hydrOXYzine (ATARAX/VISTARIL)  25 MG tablet Take 1 tablet (25 mg total) by mouth 3 (three) times daily as needed for anxiety. 04/30/16   Adonis Brook, NP  nicotine polacrilex (NICORETTE) 2 MG gum Take 1 each (2 mg total) by mouth as needed for smoking cessation. Patient not taking: Reported on 05/26/2016 04/30/16   Adonis Brook, NP  sertraline (ZOLOFT) 50 MG tablet Take 1 tablet (50 mg total) by mouth daily. 05/01/16   Adonis Brook, NP  traZODone (DESYREL) 50 MG tablet Take 1 tablet (50 mg total) by mouth at bedtime as needed for sleep. 04/30/16   Adonis Brook, NP    Family History No family history on file.  Social History Social History  Substance Use Topics  . Smoking status: Current Every Day Smoker    Packs/day: 1.00    Types: Cigarettes  . Smokeless tobacco: Never Used  . Alcohol use 14.4 oz/week    24 Cans of beer per week     Comment: BAC was clear on admission     Allergies   Patient has no known allergies.   Review of Systems Review of Systems  Constitutional: Negative for chills and fever.  HENT: Negative for congestion and facial swelling.   Eyes: Negative for discharge and visual disturbance.  Respiratory: Negative for shortness of breath.   Cardiovascular: Negative for chest pain and palpitations.  Gastrointestinal: Negative for abdominal pain, diarrhea and vomiting.  Musculoskeletal: Negative for arthralgias and myalgias.  Skin: Negative for color change and rash.  Neurological: Negative for tremors, syncope and headaches.  Psychiatric/Behavioral: Positive for suicidal ideas. Negative for confusion and dysphoric mood.     Physical Exam Updated Vital Signs BP 145/83   Pulse 94   Temp 98.6 F (37 C)   Resp 18   Ht 5\' 6"  (1.676 m)   Wt 194 lb 8 oz (88.2 kg)   SpO2 100%   BMI 31.39 kg/m   Physical Exam  Constitutional: He is oriented to person, place, and time. He appears well-developed and well-nourished.  HENT:  Head: Normocephalic and atraumatic.  Eyes: EOM are  normal. Pupils are equal, round, and reactive to light.  Neck: Normal range of motion. Neck supple. No JVD present.  Cardiovascular: Normal rate and regular rhythm.  Exam reveals no gallop and no friction rub.   No murmur heard. Pulmonary/Chest: No respiratory distress. He has no wheezes.  Abdominal: He exhibits no distension. There is no rebound and no guarding.  Musculoskeletal: Normal range of motion.  Neurological: He is alert and oriented to person, place, and time.  Skin: No rash noted. No pallor.  Psychiatric: He has a normal mood and affect. His behavior is normal. He expresses suicidal ideation. He expresses no homicidal ideation. He expresses no suicidal plans and no homicidal plans.  Nursing note and vitals reviewed.    ED Treatments / Results  Labs (all labs ordered are listed, but only abnormal results are displayed) Labs Reviewed  COMPREHENSIVE METABOLIC PANEL - Abnormal; Notable for the following:       Result Value   Creatinine, Ser 0.60 (*)    Total Bilirubin 1.3 (*)    All other components within normal limits  ETHANOL - Abnormal; Notable for the following:    Alcohol, Ethyl (B) 41 (*)    All other components within normal limits  CBC - Abnormal; Notable for the following:    WBC 12.5 (*)    All other components within normal limits  RAPID URINE DRUG SCREEN, HOSP PERFORMED    EKG  EKG Interpretation None       Radiology No results found.  Procedures Procedures (including critical care time)  Medications Ordered in ED Medications  amLODipine (NORVASC) tablet 10 mg (not administered)  busPIRone (BUSPAR) tablet 7.5 mg (not administered)  hydrOXYzine (ATARAX/VISTARIL) tablet 25 mg (not administered)  nicotine polacrilex (NICORETTE) gum 2 mg (not administered)  sertraline (ZOLOFT) tablet 50 mg (not administered)  traZODone (DESYREL) tablet 50 mg (not administered)     Initial Impression / Assessment and Plan / ED Course  I have reviewed the triage  vital signs and the nursing notes.  Pertinent labs & imaging results that were available during my care of the patient were reviewed by me and considered in my medical decision making (see chart for details).  Clinical Course     46 yo M With a chief complaint of suicidal ideation. Going on for the past couple days. Was recently admitted with the same. Feel the patient is medically clear will have TTS evaluate.  The patients results and plan were reviewed and discussed.   Any x-rays performed were independently reviewed by myself.   Differential diagnosis were considered with the presenting HPI.  Medications  amLODipine (NORVASC) tablet 10 mg (not administered)  busPIRone (BUSPAR) tablet 7.5 mg (not administered)  hydrOXYzine (ATARAX/VISTARIL) tablet 25 mg (not administered)  nicotine polacrilex (NICORETTE) gum 2 mg (not administered)  sertraline (ZOLOFT) tablet 50 mg (not administered)  traZODone (DESYREL) tablet 50 mg (not administered)  Vitals:   05/31/16 2011 05/31/16 2014  BP: 145/83   Pulse: 85 94  Resp: 18   Temp: 98.6 F (37 C)   SpO2:  100%  Weight:  194 lb 8 oz (88.2 kg)  Height:  5\' 6"  (1.676 m)    Final diagnoses:  Suicidal ideation       Final Clinical Impressions(s) / ED Diagnoses   Final diagnoses:  Suicidal ideation    New Prescriptions New Prescriptions   No medications on file     Melene PlanDan Shiann Kam, DO 06/01/16 1408

## 2016-05-31 NOTE — ED Triage Notes (Signed)
The pt reports that he is suicidal he just left here Saturday for the same  He reports that he has been beating himself in the head and has a headache from that

## 2016-06-01 DIAGNOSIS — F332 Major depressive disorder, recurrent severe without psychotic features: Secondary | ICD-10-CM

## 2016-06-01 DIAGNOSIS — R45851 Suicidal ideations: Secondary | ICD-10-CM | POA: Diagnosis not present

## 2016-06-01 DIAGNOSIS — Z79899 Other long term (current) drug therapy: Secondary | ICD-10-CM

## 2016-06-01 DIAGNOSIS — F1020 Alcohol dependence, uncomplicated: Secondary | ICD-10-CM

## 2016-06-01 DIAGNOSIS — F17210 Nicotine dependence, cigarettes, uncomplicated: Secondary | ICD-10-CM

## 2016-06-01 NOTE — Consult Note (Signed)
Telepsych Consultation   Reason for Consult:  Suicidal ideations Referring Physician:  EDP Patient Identification: Ralph Anderson MRN:  [id number] Principal Diagnosis: Suicidal ideations Diagnosis:   Patient Active Problem List   Diagnosis Date Noted  . Suicidal ideations [R45.851]     Priority: High  . Alcohol use disorder, severe, dependence ([place]) [F10.20] 04/30/2016    Priority: High  . Chronic pain of lower extremity, bilateral [M79.604, M79.605, G89.29]   . MDD (major depressive disorder), recurrent severe, without psychosis ([place]) [F33.2] 04/25/2016    Total Time spent with patient: 30 minutes  Subjective:   Ralph Anderson is a 46 y.o. male patient admitted with suicidal ideations.  HPI:  Per tele assessment note on chart written by Curlene Dolphin, [facility] Counselor: Ralph Anderson is an 46 y.o. male.  -Clinician reviewed note from Dr. Deno Etienne.  46 yo M with a chief complaint of suicidal ideation. Patient was just discharged from behavioral health with the same. He feels that he has become suicidal again. States that he was at a shelter where he took a bath and became angry and the people that were there. He felt the need to take his life. Denies prior suicidal attempts. Is unsure of the current plan. Denies illegal drug use. States that he has been compliant with his medications except for today.  Patient says he has thoughts of killing himself.  Patient says "I just get this feeling like I should die and not be on this earth anymore."  Patient says he has thoughts of stepping in front of a car to die.  Patient denies any HI or A/V hallucinations.  Patient says he was recently discharged from [place] for SA issues.  He said that he has been trying not to drink as much.  Patient says he used to drink daily.  He now says he drinks less than daily.  He has consumed 3 beers today.  Patient is currently unable to contract for safety at this time.  Patient has no current outpatient  resources.  Patient was at [facility] in December 2017.  Today during tele psych consult: Pt was seen and chart reviewed.  Pt denies homicidal ideation, denies auditory/visual hallucinations and does not appear to be responding to internal stimuli. Pt endorses increased suicidal ideations but has never acted on these thoughts. Pt stated his plan would be to walk out in front of traffic but he doesn't do it because he would not want anyone in the car that would hit him to be hurt. Pt stated he left [facility] because he becam eupset with the way people were acting there. Pt states "I do not have a therapist or psychiatrist but would like to see someone. Pt stated he will return to his girlfriend's house upon discharge.  Past Psychiatric History: Substance abuse, suicidal ideations  Risk to Self: Suicidal Ideation: Yes-Currently Present Suicidal Intent: Yes-Currently Present Is patient at risk for suicide?: Yes Suicidal Plan?: Yes-Currently Present Specify Current Suicidal Plan: Stepping in front of a car Access to Means: Yes Specify Access to Suicidal Means: Traffic & cars What has been your use of drugs/alcohol within the last 12 months?: ETOH How many times?: 0 Other Self Harm Risks: Will hit himself in the head Triggers for Past Attempts: Unpredictable ("I get aggrevated and just can't stand it") Intentional Self Injurious Behavior: Damaging (Hitting himself) Comment - Self Injurious Behavior: Hitting himself on the head Risk to Others: Homicidal Ideation: No Thoughts of Harm to  Others: No Current Homicidal Intent: No Current Homicidal Plan: No Access to Homicidal Means: No Identified Victim: No one History of harm to others?: No Assessment of Violence: None Noted Violent Behavior Description: N/A Does patient have access to weapons?: No Criminal Charges Pending?: Yes Describe Pending Criminal Charges: DUI Does patient have a court date: Yes Court Date:  06/21/16 Prior Inpatient Therapy: Prior Inpatient Therapy: Yes Prior Therapy Dates: December 2017 Prior Therapy Facilty/Provider(s): [facility] Reason for Treatment: SI Prior Outpatient Therapy: Prior Outpatient Therapy: No Prior Therapy Dates: N/A Prior Therapy Facilty/Provider(s): NA Reason for Treatment: N/A Does patient have an ACCT team?: No Does patient have Intensive In-House Services?  : No Does patient have [facility] services? : No Does patient have P4[facility] services?: No  Past Medical History:  Past Medical History:  Diagnosis Date  . Hypertension    History reviewed. No pertinent surgical history. Family History: No family history on file. Family Psychiatric  History: Unknown Social History:  History  Alcohol Use  . 14.4 oz/week  . 24 Cans of beer per week    Comment: BAC was clear on admission     History  Drug Use No    Comment: Pt stated "it's been 10 yrs since I smoked marijuana"    Social History   Social History  . Marital status: Single    Spouse name: N/A  . Number of children: N/A  . Years of education: N/A   Social History Main Topics  . Smoking status: Current Every Day Smoker    Packs/day: 1.00    Types: Cigarettes  . Smokeless tobacco: Never Used  . Alcohol use 14.4 oz/week    24 Cans of beer per week     Comment: BAC was clear on admission  . Drug use: No     Comment: Pt stated "it's been 10 yrs since I smoked marijuana"  . Sexual activity: Yes   Other Topics Concern  . None   Social History Narrative  . None   Additional Social History:    Allergies:  No Known Allergies  Labs:  Results for orders placed or performed during the hospital encounter of 05/31/16 (from the past 48 hour(s))  Comprehensive metabolic panel     Status: Abnormal   Collection Time: 05/31/16  8:23 PM  Result Value Ref Range   Sodium 140 135 - 145 mmol/L   Potassium 3.7 3.5 - 5.1 mmol/L   Chloride 102 101 - 111 mmol/L   CO2 23 22 - 32 mmol/L   Glucose, Bld 84 65  - 99 mg/dL   BUN 11 6 - 20 mg/dL   Creatinine, Ser 0.60 (L) 0.61 - 1.24 mg/dL   Calcium 9.5 8.9 - 10.3 mg/dL   Total Protein 7.2 6.5 - 8.1 g/dL   Albumin 4.4 3.5 - 5.0 g/dL   AST 27 15 - 41 U/L   ALT 31 17 - 63 U/L   Alkaline Phosphatase 99 38 - 126 U/L   Total Bilirubin 1.3 (H) 0.3 - 1.2 mg/dL   GFR calc non Af Amer >60 >60 mL/min   GFR calc Af Amer >60 >60 mL/min    Comment: (NOTE) The eGFR has been calculated using the CKD EPI equation. This calculation has not been validated in all clinical situations. eGFR's persistently <60 mL/min signify possible Chronic Kidney Disease.    Anion gap 15 5 - 15  Ethanol     Status: Abnormal   Collection Time: 05/31/16  8:23 PM  Result Value  Ref Range   Alcohol, Ethyl (B) 41 (H) <5 mg/dL    Comment:        LOWEST DETECTABLE LIMIT FOR SERUM ALCOHOL IS 5 mg/dL FOR MEDICAL PURPOSES ONLY   cbc     Status: Abnormal   Collection Time: 05/31/16  8:23 PM  Result Value Ref Range   WBC 12.5 (H) 4.0 - 10.5 K/uL   RBC 5.33 4.22 - 5.81 MIL/uL   Hemoglobin 16.0 13.0 - 17.0 g/dL   HCT 44.7 39.0 - 52.0 %   MCV 83.9 78.0 - 100.0 fL   MCH 30.0 26.0 - 34.0 pg   MCHC 35.8 30.0 - 36.0 g/dL   RDW 14.0 11.5 - 15.5 %   Platelets 185 150 - 400 K/uL  Rapid urine drug screen (hospital performed)     Status: None   Collection Time: 05/31/16  8:26 PM  Result Value Ref Range   Opiates NONE DETECTED NONE DETECTED   Cocaine NONE DETECTED NONE DETECTED   Benzodiazepines NONE DETECTED NONE DETECTED   Amphetamines NONE DETECTED NONE DETECTED   Tetrahydrocannabinol NONE DETECTED NONE DETECTED   Barbiturates NONE DETECTED NONE DETECTED    Comment:        DRUG SCREEN FOR MEDICAL PURPOSES ONLY.  IF CONFIRMATION IS NEEDED FOR ANY PURPOSE, NOTIFY LAB WITHIN 5 DAYS.        LOWEST DETECTABLE LIMITS FOR URINE DRUG SCREEN Drug Class       Cutoff (ng/mL) Amphetamine      1000 Barbiturate      200 Benzodiazepine   235 Tricyclics       573 Opiates           300 Cocaine          300 THC              50     Current Facility-Administered Medications  Medication Dose Route Frequency Provider Last Rate Last Dose  . amLODipine (NORVASC) tablet 10 mg  10 mg Oral Daily Deno Etienne, DO   10 mg at 06/01/16 1030  . busPIRone (BUSPAR) tablet 7.5 mg  7.5 mg Oral BID Deno Etienne, DO   7.5 mg at 06/01/16 1031  . hydrOXYzine (ATARAX/VISTARIL) tablet 25 mg  25 mg Oral TID PRN Deno Etienne, DO      . nicotine polacrilex (NICORETTE) gum 2 mg  2 mg Oral PRN Deno Etienne, DO      . sertraline (ZOLOFT) tablet 50 mg  50 mg Oral Daily Deno Etienne, DO   50 mg at 06/01/16 1032  . traZODone (DESYREL) tablet 50 mg  50 mg Oral QHS PRN Deno Etienne, DO       Current Outpatient Prescriptions  Medication Sig Dispense Refill  . amLODipine (NORVASC) 10 MG tablet Take 1 tablet (10 mg total) by mouth daily. 30 tablet 0  . busPIRone (BUSPAR) 7.5 MG tablet Take 1 tablet (7.5 mg total) by mouth 2 (two) times daily. 60 tablet 0  . hydrOXYzine (ATARAX/VISTARIL) 25 MG tablet Take 1 tablet (25 mg total) by mouth 3 (three) times daily as needed for anxiety. 30 tablet 0  . nicotine polacrilex (NICORETTE) 2 MG gum Take 1 each (2 mg total) by mouth as needed for smoking cessation. (Patient not taking: Reported on 05/26/2016) 100 tablet 0  . sertraline (ZOLOFT) 50 MG tablet Take 1 tablet (50 mg total) by mouth daily. 30 tablet 0  . traZODone (DESYREL) 50 MG tablet Take 1 tablet (50 mg total) by mouth  at bedtime as needed for sleep. 30 tablet 0    Musculoskeletal: Unable to assess: camera  Psychiatric Specialty Exam: Physical Exam  Review of Systems  Psychiatric/Behavioral: Positive for depression, substance abuse and suicidal ideas. Negative for hallucinations and memory loss. The patient is not nervous/anxious and does not have insomnia.   All other systems reviewed and are negative.   Blood pressure 127/68, pulse 85, temperature 98.1 F (36.7 C), temperature source Oral, resp. rate 18, height 5'  6" (1.676 m), weight 88.2 kg (194 lb 8 oz), SpO2 95 %.Body mass index is 31.39 kg/m.  General Appearance: Casual  Eye Contact:  Good  Speech:  Clear and Coherent and Normal Rate  Volume:  Normal  Mood:  Depressed  Affect:  Congruent  Thought Process:  Coherent, Goal Directed and Linear  Orientation:  Full (Time, Place, and Person)  Thought Content:  Logical  Suicidal Thoughts:  Yes.  without intent/plan  Homicidal Thoughts:  No  Memory:  Immediate;   Good Recent;   Fair Remote;   Fair  Judgement:  Good  Insight:  Fair  Psychomotor Activity:  Normal  Concentration:  Concentration: Good and Attention Span: Good  Recall:  Good  Fund of Knowledge:  Good  Language:  Good  Akathisia:  No  Handed:  Right  AIMS (if indicated):     Assets:  Communication Skills Desire for Improvement Financial Resources/Insurance Housing Resilience Social Support  ADL's:  Intact  Cognition:  WNL  Sleep:        Treatment Plan Summary: Discharge home   Provide resources for outpatient therapy/psychiatry Maintain medication compliance: Zoloft 50 mg QD Buspar 7.58m BID Hydroxyzine 25 mg TID PRN Follow up with PCP for any new or existing medical issues Activity as tolerated  Disposition: No evidence of imminent risk to self or others at present.   Patient does not meet criteria for psychiatric inpatient admission. Supportive therapy provided about ongoing stressors. Discussed crisis plan, support from social network, calling 911, coming to the Emergency Department, and calling Suicide Hotline.  Ralph Hal NP 06/01/2016 10:40 AM

## 2016-06-01 NOTE — Progress Notes (Signed)
Faxed list of area SA providers to [facility] to provide to pt upon d/c.  Ilean SkillMeghan [place], MSW, LCSW Clinical Social Work, Disposition  06/01/2016 [phone]

## 2016-06-01 NOTE — ED Provider Notes (Signed)
Patient evaluated by behavioral health. Does not need inpatient criteria. Patient will be discharged. Has multiple outpatient resources. Patient is no longer suicidal.   Rolland PorterMark Caitlin Ainley, MD 06/01/16 1102

## 2016-06-01 NOTE — ED Notes (Signed)
Pt belongings returned to him, had 3 bags, and valuable envelope returned. Pt is eating tray and then will bed discharged.

## 2016-06-01 NOTE — ED Notes (Signed)
Meal tray ordered for patient.

## 2016-06-02 ENCOUNTER — Encounter (HOSPITAL_COMMUNITY): Payer: Self-pay | Admitting: *Deleted

## 2016-06-02 ENCOUNTER — Emergency Department (HOSPITAL_COMMUNITY)
Admission: EM | Admit: 2016-06-02 | Discharge: 2016-06-03 | Disposition: A | Discharge status: Other Acute Inpatient Hospital | Payer: Self-pay | Attending: Emergency Medicine | Admitting: Emergency Medicine

## 2016-06-02 DIAGNOSIS — I10 Essential (primary) hypertension: Secondary | ICD-10-CM | POA: Insufficient documentation

## 2016-06-02 DIAGNOSIS — R45851 Suicidal ideations: Secondary | ICD-10-CM

## 2016-06-02 DIAGNOSIS — F17210 Nicotine dependence, cigarettes, uncomplicated: Secondary | ICD-10-CM | POA: Insufficient documentation

## 2016-06-02 DIAGNOSIS — Z79899 Other long term (current) drug therapy: Secondary | ICD-10-CM | POA: Insufficient documentation

## 2016-06-02 DIAGNOSIS — F332 Major depressive disorder, recurrent severe without psychotic features: Secondary | ICD-10-CM | POA: Insufficient documentation

## 2016-06-02 LAB — COMPREHENSIVE METABOLIC PANEL
ALK PHOS: 100 U/L (ref 38–126)
ALT: 28 U/L (ref 17–63)
ANION GAP: 8 (ref 5–15)
AST: 26 U/L (ref 15–41)
Albumin: 4.4 g/dL (ref 3.5–5.0)
BUN: 15 mg/dL (ref 6–20)
CALCIUM: 9.2 mg/dL (ref 8.9–10.3)
CO2: 25 mmol/L (ref 22–32)
CREATININE: 0.65 mg/dL (ref 0.61–1.24)
Chloride: 106 mmol/L (ref 101–111)
Glucose, Bld: 95 mg/dL (ref 65–99)
Potassium: 3.9 mmol/L (ref 3.5–5.1)
Sodium: 139 mmol/L (ref 135–145)
TOTAL PROTEIN: 6.8 g/dL (ref 6.5–8.1)
Total Bilirubin: 0.9 mg/dL (ref 0.3–1.2)

## 2016-06-02 LAB — CBC
HCT: 40.4 % (ref 39.0–52.0)
HEMOGLOBIN: 14.2 g/dL (ref 13.0–17.0)
MCH: 28.9 pg (ref 26.0–34.0)
MCHC: 35.1 g/dL (ref 30.0–36.0)
MCV: 82.3 fL (ref 78.0–100.0)
Platelets: 195 10*3/uL (ref 150–400)
RBC: 4.91 MIL/uL (ref 4.22–5.81)
RDW: 13.4 % (ref 11.5–15.5)
WBC: 9.5 10*3/uL (ref 4.0–10.5)

## 2016-06-02 LAB — ACETAMINOPHEN LEVEL: Acetaminophen (Tylenol), Serum: <10 ug/mL — ABNORMAL LOW (ref 10–30)

## 2016-06-02 LAB — SALICYLATE LEVEL

## 2016-06-02 LAB — ETHANOL: ALCOHOL ETHYL (B): 99 mg/dL — AB (ref <5)

## 2016-06-02 MED ORDER — HYDROXYZINE HCL 25 MG PO TABS
25.0000 mg | ORAL_TABLET | Freq: Three times a day (TID) | ORAL | Status: DC | PRN
Start: 2016-06-02 — End: 2016-06-03

## 2016-06-02 MED ORDER — AMLODIPINE BESYLATE 5 MG PO TABS: 10.0000 mg | ORAL_TABLET | Freq: Every day | ORAL | Status: DC

## 2016-06-02 MED ORDER — TRAZODONE HCL 50 MG PO TABS: 50.0000 mg | ORAL_TABLET | Freq: Every evening | ORAL | Status: DC | PRN

## 2016-06-02 MED ORDER — BUSPIRONE HCL 15 MG PO TABS
7.5000 mg | ORAL_TABLET | Freq: Two times a day (BID) | ORAL | Status: DC
  Administered 2016-06-02: 7.5 mg via ORAL
  Filled 2016-06-02 (×2): qty 1

## 2016-06-02 MED ORDER — SERTRALINE HCL 50 MG PO TABS: 50.0000 mg | ORAL_TABLET | Freq: Every day | ORAL | Status: DC

## 2016-06-02 NOTE — ED Triage Notes (Signed)
Pt brought in by [employer] d/t pt stating "I was going to jump out in front of a car."

## 2016-06-02 NOTE — ED Notes (Signed)
Pt stated "I started to walk out in front of a couple of cars but decided not to."

## 2016-06-03 ENCOUNTER — Observation Stay (HOSPITAL_COMMUNITY)
Admission: AD | Admit: 2016-06-03 | Discharge: 2016-06-03 | Disposition: A | Discharge status: Home / Self Care | Payer: Federal, State, Local not specified - Other | Source: Intra-hospital | Attending: Psychiatry | Admitting: Psychiatry

## 2016-06-03 ENCOUNTER — Encounter (HOSPITAL_COMMUNITY): Payer: Self-pay

## 2016-06-03 DIAGNOSIS — F1020 Alcohol dependence, uncomplicated: Secondary | ICD-10-CM | POA: Diagnosis not present

## 2016-06-03 DIAGNOSIS — I10 Essential (primary) hypertension: Secondary | ICD-10-CM | POA: Insufficient documentation

## 2016-06-03 DIAGNOSIS — M79605 Pain in left leg: Secondary | ICD-10-CM | POA: Insufficient documentation

## 2016-06-03 DIAGNOSIS — F339 Major depressive disorder, recurrent, unspecified: Secondary | ICD-10-CM | POA: Diagnosis present

## 2016-06-03 DIAGNOSIS — F1024 Alcohol dependence with alcohol-induced mood disorder: Secondary | ICD-10-CM | POA: Diagnosis present

## 2016-06-03 DIAGNOSIS — Z79899 Other long term (current) drug therapy: Secondary | ICD-10-CM | POA: Diagnosis not present

## 2016-06-03 DIAGNOSIS — G8929 Other chronic pain: Secondary | ICD-10-CM | POA: Insufficient documentation

## 2016-06-03 DIAGNOSIS — R45851 Suicidal ideations: Secondary | ICD-10-CM | POA: Diagnosis not present

## 2016-06-03 DIAGNOSIS — Z590 Homelessness: Secondary | ICD-10-CM | POA: Insufficient documentation

## 2016-06-03 DIAGNOSIS — F332 Major depressive disorder, recurrent severe without psychotic features: Secondary | ICD-10-CM | POA: Diagnosis not present

## 2016-06-03 DIAGNOSIS — F419 Anxiety disorder, unspecified: Secondary | ICD-10-CM | POA: Insufficient documentation

## 2016-06-03 DIAGNOSIS — M79604 Pain in right leg: Secondary | ICD-10-CM | POA: Insufficient documentation

## 2016-06-03 DIAGNOSIS — F17210 Nicotine dependence, cigarettes, uncomplicated: Secondary | ICD-10-CM | POA: Insufficient documentation

## 2016-06-03 HISTORY — DX: Major depressive disorder, single episode, unspecified: F32.9

## 2016-06-03 HISTORY — DX: Depression, unspecified: F32.A

## 2016-06-03 HISTORY — DX: Anxiety disorder, unspecified: F41.9

## 2016-06-03 LAB — RAPID URINE DRUG SCREEN, HOSP PERFORMED
Amphetamines: NOT DETECTED
BARBITURATES: NOT DETECTED
Benzodiazepines: NOT DETECTED
Cocaine: NOT DETECTED
Opiates: NOT DETECTED
Tetrahydrocannabinol: NOT DETECTED

## 2016-06-03 MED ORDER — AMLODIPINE BESYLATE 10 MG PO TABS: 10.0000 mg | ORAL_TABLET | Freq: Every day | ORAL | 0 refills | Status: DC

## 2016-06-03 MED ORDER — VITAMIN B-1 100 MG PO TABS: 100.0000 mg | ORAL_TABLET | Freq: Every day | ORAL | Status: DC

## 2016-06-03 MED ORDER — ACETAMINOPHEN 325 MG PO TABS: 650.0000 mg | ORAL_TABLET | Freq: Four times a day (QID) | ORAL | Status: DC | PRN

## 2016-06-03 MED ORDER — HYDROXYZINE HCL 25 MG PO TABS
25.0000 mg | ORAL_TABLET | Freq: Four times a day (QID) | ORAL | Status: DC | PRN
  Administered 2016-06-03: 25 mg via ORAL
  Filled 2016-06-03: qty 1

## 2016-06-03 MED ORDER — ADULT MULTIVITAMIN W/MINERALS CH
1.0000 | ORAL_TABLET | Freq: Every day | ORAL | Status: DC
  Administered 2016-06-03: 1 via ORAL
  Filled 2016-06-03: qty 1

## 2016-06-03 MED ORDER — LOPERAMIDE HCL 2 MG PO CAPS: 2.0000 mg | ORAL_CAPSULE | ORAL | Status: DC | PRN

## 2016-06-03 MED ORDER — SERTRALINE HCL 50 MG PO TABS
50.0000 mg | ORAL_TABLET | Freq: Every day | ORAL | Status: DC
  Administered 2016-06-03: 50 mg via ORAL
  Filled 2016-06-03: qty 1

## 2016-06-03 MED ORDER — AMLODIPINE BESYLATE 5 MG PO TABS
10.0000 mg | ORAL_TABLET | Freq: Every day | ORAL | Status: DC
  Administered 2016-06-03: 10 mg via ORAL
  Filled 2016-06-03: qty 2

## 2016-06-03 MED ORDER — BUSPIRONE HCL 15 MG PO TABS
7.5000 mg | ORAL_TABLET | Freq: Two times a day (BID) | ORAL | Status: DC
  Administered 2016-06-03: 7.5 mg via ORAL
  Filled 2016-06-03: qty 1

## 2016-06-03 MED ORDER — LORAZEPAM 1 MG PO TABS: 1.0000 mg | ORAL_TABLET | Freq: Four times a day (QID) | ORAL | Status: DC | PRN

## 2016-06-03 MED ORDER — TRAZODONE HCL 50 MG PO TABS: 50.0000 mg | ORAL_TABLET | Freq: Every evening | ORAL | 0 refills | Status: DC | PRN

## 2016-06-03 MED ORDER — BUSPIRONE HCL 7.5 MG PO TABS: 7.5000 mg | ORAL_TABLET | Freq: Two times a day (BID) | ORAL | 0 refills | Status: DC

## 2016-06-03 MED ORDER — BUSPIRONE HCL 7.5 MG PO TABS: 7.5000 mg | ORAL_TABLET | Freq: Two times a day (BID) | ORAL | Status: DC

## 2016-06-03 MED ORDER — SERTRALINE HCL 50 MG PO TABS: 50.0000 mg | ORAL_TABLET | Freq: Every day | ORAL | 0 refills | Status: DC

## 2016-06-03 MED ORDER — TRAZODONE HCL 50 MG PO TABS
50.0000 mg | ORAL_TABLET | Freq: Every evening | ORAL | Status: DC | PRN
Start: 2016-06-03 — End: 2016-06-03

## 2016-06-03 MED ORDER — ALUM & MAG HYDROXIDE-SIMETH 200-200-20 MG/5ML PO SUSP: 30.0000 mL | ORAL | Status: DC | PRN

## 2016-06-03 MED ORDER — MAGNESIUM HYDROXIDE 400 MG/5ML PO SUSP: 30.0000 mL | Freq: Every day | ORAL | Status: DC | PRN

## 2016-06-03 MED ORDER — HYDROXYZINE HCL 25 MG PO TABS: 25.0000 mg | ORAL_TABLET | Freq: Four times a day (QID) | ORAL | 0 refills | Status: DC | PRN

## 2016-06-03 MED ORDER — ONDANSETRON 4 MG PO TBDP: 4.0000 mg | ORAL_TABLET | Freq: Four times a day (QID) | ORAL | Status: DC | PRN

## 2016-06-03 NOTE — Progress Notes (Signed)
Patient will be discharged this afternoon. He plans to follow-up with [facility] on Monday 06/06/2016 Carmell AustriaAisha Melat Wrisley 3:20pm
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
## 2016-06-03 NOTE — Discharge Summary (Signed)
Observation Unit Discharge Summary Note  Patient:  Ralph Anderson is an 46 y.o., male MRN:  [id number] DOB:  May 26, 1970 Patient phone:  [phone] (home)  Patient address:   [place] [address],  Total Time spent with patient: 15 minutes  Date of Admission:  06/03/2016 Date of Discharge: 06/03/2016  Reason for Admission:  Alcohol abuse and suicidal ideation  Principal Problem: Major depression, recurrent [facility]) Discharge Diagnoses: Patient Active Problem List   Diagnosis Date Noted  . Major depression, recurrent (HCC) [F33.9] 06/03/2016    Priority: High  . Suicidal ideations [R45.851]     Priority: High  . Alcohol use disorder, severe, dependence (HCC) [F10.20] 04/30/2016    Priority: High  . Chronic pain of lower extremity, bilateral [M79.604, M79.605, G89.29]   . MDD (major depressive disorder), recurrent severe, without psychosis (HCC) [F33.2] 04/25/2016    Past Psychiatric History: MDD, Suicidal ideation, alcohol abuse  Past Medical History:  Past Medical History:  Diagnosis Date  . Anxiety   . Depression   . Hypertension    History reviewed. No pertinent surgical history. Family History: History reviewed. No pertinent family history. Family Psychiatric  History: Unknown Social History:  History  Alcohol Use  . 3.6 - 7.2 oz/week  . 6 - 12 Cans of beer per week    Comment: He reports weekly use     History  Drug Use No    Comment: Pt stated "it's been 10 yrs since I smoked marijuana"    Social History   Social History  . Marital status: Single    Spouse name: N/A  . Number of children: N/A  . Years of education: N/A   Social History Main Topics  . Smoking status: Current Every Day Smoker    Packs/day: 1.00    Types: Cigarettes  . Smokeless tobacco: Never Used  . Alcohol use 3.6 - 7.2 oz/week    6 - 12 Cans of beer per week     Comment: He reports weekly use  . Drug use: No     Comment: Pt stated "it's been 10 yrs since I smoked marijuana"   . Sexual activity: Yes   Other Topics Concern  . None   Social History Narrative  . None    Hospital Course:  Pt spent one day in the observation unit without incident. Pt was calm and cooperative, alert & oriented, and appropriate with staff. Pt elected to discharge home with his girlfriend this afternoon. Pt will follow-up with [facility] services on Monday. This Clinical research associate requested a 7 day sample of medications until Pt can get to [place] and also get to his friends house to retrieve his medications. Pt stated he left his medications at his friends house and the friend will not be home until the middle of the week. Pt is motivated to seek outpatient assistance for his alcohol abuse and depression as his plan is to attempt to get a job and find his own living arrangements. Pt is stable for discharge from [facility] OBS Unit.   Physical Findings: AIMS: Facial and Oral Movements Muscles of Facial Expression: None, normal Lips and Perioral Area: None, normal Jaw: None, normal Tongue: None, normal,Extremity Movements Upper (arms, wrists, hands, fingers): None, normal Lower (legs, knees, ankles, toes): None, normal, Trunk Movements Neck, shoulders, hips: None, normal, Overall Severity Severity of abnormal movements (highest score from questions above): None, normal Incapacitation due to abnormal movements: None, normal Patient's awareness of abnormal movements (rate only patient's report): No Awareness, Dental  Status Current problems with teeth and/or dentures?: No Does patient usually wear dentures?: No  CIWA:  CIWA-Ar Total: 3 COWS:     Musculoskeletal: Strength & Muscle Tone: within normal limits Gait & Station: normal Patient leans: N/A  Psychiatric Specialty Exam: Physical Exam  Review of Systems  Psychiatric/Behavioral: Positive for depression, substance abuse and suicidal ideas. Negative for hallucinations and memory loss. The patient is not nervous/anxious and does not have insomnia.    All other systems reviewed and are negative.   Blood pressure (!) 147/76, pulse 84, temperature 97.7 F (36.5 C), temperature source Oral, resp. rate 16, height 5\' 6"  (1.676 m), weight 88 kg (194 lb), SpO2 98 %.Body mass index is 31.31 kg/m.  General Appearance: Casual  Eye Contact:  Good  Speech:  Clear and Coherent and Normal Rate  Volume:  Normal  Mood:  Depressed  Affect:  Congruent and Depressed  Thought Process:  Coherent, Goal Directed and Linear  Orientation:  Full (Time, Place, and Person)  Thought Content:  Logical  Suicidal Thoughts:  Yes.  without intent/plan  Homicidal Thoughts:  No  Memory:  Immediate;   Good Recent;   Fair Remote;   Fair  Judgement:  Fair  Insight:  Fair  Psychomotor Activity:  Normal  Concentration:  Concentration: Good  Recall:  Good  Fund of Knowledge:  Good  Language:  Good  Akathisia:  No  Handed:  Right  AIMS (if indicated):     Assets:  Communication Skills Desire for Improvement Financial Resources/Insurance Housing Physical Health Resilience Social Support  ADL's:  Intact  Cognition:  WNL  Sleep:   Fair        Has this patient used any form of tobacco in the last 30 days? (Cigarettes, Smokeless Tobacco, Cigars, and/or Pipes) Yes, No  Blood Alcohol level:  Lab Results  Component Value Date   ETH 99 (H) 06/02/2016   ETH 41 (H) 05/31/2016    Metabolic Disorder Labs:  No results found for: HGBA1C, MPG No results found for: PROLACTIN No results found for: CHOL, TRIG, HDL, CHOLHDL, VLDL, LDLCALC  See Psychiatric Specialty Exam and Suicide Risk Assessment completed by Attending Physician prior to discharge.  Discharge destination:  Home  Is patient on multiple antipsychotic therapies at discharge:  No   Has Patient had three or more failed trials of antipsychotic monotherapy by history:  No  Recommended Plan for Multiple Antipsychotic Therapies: NA      Follow-up recommendations:  Activity:  as tolerated Diet:   regular Other:  Follow up witrh outpatient resources as provide. [facility] and medication management.  Comments:  Discharge home  Signed: Laveda AbbeLaurie Britton Jahmari Esbenshade, NP 06/03/2016, 2:58 PM

## 2016-06-03 NOTE — H&P (Signed)
[facility] Provider Admission PAA/H&P  Patient Identification: Ralph Anderson MRN:  [id number] Date of Evaluation:  06/03/2016 Chief Complaint:  Suicidal thoughts Principal Diagnosis: MDD,REC,SEV ETOH USE DISORDER Diagnosis:   Patient Active Problem List   Diagnosis Date Noted  . Major depression, recurrent ([place]) [F33.9] 06/03/2016  . Chronic pain of lower extremity, bilateral [M79.604, M79.605, G89.29]   . Suicidal ideations [R45.851]   . Alcohol use disorder, severe, dependence ([place]) [F10.20] 04/30/2016  . MDD (major depressive disorder), recurrent severe, without psychosis ([place]) [F33.2] 04/25/2016   History of Present Illness:Per tele assessment note 06/03/2016 Ralph Anderson is a 46 y.o. male presents voluntarily to [facility] accompanied by [employer]. Pt states he was discharged from [facility] yesterday and when he left he still felt S/I. Pt states he thought he could handle the depressed feelings but the symptoms kept increasing. Pt states he had urges tonight to jump out in front of cars in a suicide attempt but his girlfriend was with him and was able to talk to him out of doing it. Pt states that he is homeless and sometimes stays with his girlfriend and his friend. Pt states he feels, "isolated and does not know what his purpose in life is". Pt states these thoughts have made him want to die.   Pt denies H/I and AV hallucinations. Pt reports he sometimes hits himself in order to calm his nerves and regulate himself. Pt also stated he gets really paranoid when he is walking around on the streets because he feels like somebody is always behind him. Pt reports that he has an ongoing hx with drinking alcohol. Pt reports that he drank a couple of beers tonight in an attempt to make himself feel better. Pt reports that he was in treatment at [facility] for over 20 days and left early due to being distracted by his girlfriend. Pt states up until yesterday he did not drink for 2 weeks.   Pt states he does  take medications for depression but did not take them today because they were at his friend's house and he does not have access to them currently. PT reports he was admitted at [facility] in December 2017.  On Exam: Patient appears casual and dressed in scrubs. Pleasant throughout the exam. States "I came in  Last night because I walked in front of two cars and my girlfriend made me come in." States that he has suicidal "thoughts every other week or sometimes every week and yesterday it got really bad." Patient currently denies any suicidal thoughts, homicidal ideations, and audiovisual hallucinations. Reports feeling paranoid at times and feels like someone is following him, so he looks back frequently when he is walking. Reports that he was at [facility] for twenty days and left with his girlfriend one Saturday after her visit. Reports that he has been taking medication as prescribed, however he left his medications at a friends house and now that friend is out of town. He is unsure of when the friend will return "it may be a week or two." Reports that he has two DWIs with court cases pending 2/7 and 3/22. Patient was evaluated and discharged from [facility] on 06/02/2015 after denying suicidal thoughts and requesting outpatient treatment.  Associated Signs/Symptoms: Depression Symptoms:  depressed mood, feelings of worthlessness/guilt, difficulty concentrating, hopelessness, (Hypo) Manic Symptoms:  Impulsivity, Anxiety Symptoms:  Excessive Worry, Psychotic Symptoms:  Paranoia, PTSD Symptoms: NA Total Time spent with patient: 30 minutes  Past Psychiatric History: Depression, Suicidal Ideations, Substanace Abuse  Is the patient at risk to self? Yes.    Has the patient been a risk to self in the past 6 months? Yes.    Has the patient been a risk to self within the distant past? Yes.    Is the patient a risk to others? No.  Has the patient been a risk to others in the past 6 months? No.  Has the patient been a  risk to others within the distant past? No.   Prior Inpatient Therapy:  [facility] 04/2016, [facility]  Prior Outpatient Therapy:  Yes  Alcohol Screening:   Substance Abuse History in the last 12 months:  Yes.   Consequences of Substance Abuse: Legal Consequences:  DWI Family Consequences:     Previous Psychotropic Medications: Yes  Psychological Evaluations: Yes  Past Medical History:  Past Medical History:  Diagnosis Date  . Anxiety   . Depression   . Hypertension    No past surgical history on file. Family History: No family history on file. Family Psychiatric History: No pertinent history Tobacco Screening:   Social History:  History  Alcohol Use  . 3.6 - 7.2 oz/week  . 6 - 12 Cans of beer per week    Comment: He reports weekly use     History  Drug Use No    Comment: Pt stated "it's been 10 yrs since I smoked marijuana"    Additional Social History:      History of alcohol / drug use?: Yes Negative Consequences of Use: Legal, Financial, Personal relationships Name of Substance 1: ETOH 1 - Frequency: reports weekly now; states cut down since [facility]                  Allergies:  No Known Allergies Lab Results:  Results for orders placed or performed during the hospital encounter of 06/02/16 (from the past 48 hour(s))  Rapid urine drug screen (hospital performed)     Status: None   Collection Time: 06/02/16 12:57 AM  Result Value Ref Range   Opiates NONE DETECTED NONE DETECTED   Cocaine NONE DETECTED NONE DETECTED   Benzodiazepines NONE DETECTED NONE DETECTED   Amphetamines NONE DETECTED NONE DETECTED   Tetrahydrocannabinol NONE DETECTED NONE DETECTED   Barbiturates NONE DETECTED NONE DETECTED    Comment:        DRUG SCREEN FOR MEDICAL PURPOSES ONLY.  IF CONFIRMATION IS NEEDED FOR ANY PURPOSE, NOTIFY LAB WITHIN 5 DAYS.        LOWEST DETECTABLE LIMITS FOR URINE DRUG SCREEN Drug Class       Cutoff (ng/mL) Amphetamine      1000 Barbiturate       200 Benzodiazepine   563 Tricyclics       149 Opiates          300 Cocaine          300 THC              50   Comprehensive metabolic panel     Status: None   Collection Time: 06/02/16  9:58 PM  Result Value Ref Range   Sodium 139 135 - 145 mmol/L   Potassium 3.9 3.5 - 5.1 mmol/L   Chloride 106 101 - 111 mmol/L   CO2 25 22 - 32 mmol/L   Glucose, Bld 95 65 - 99 mg/dL   BUN 15 6 - 20 mg/dL   Creatinine, Ser 0.65 0.61 - 1.24 mg/dL   Calcium 9.2 8.9 - 10.3 mg/dL  Total Protein 6.8 6.5 - 8.1 g/dL   Albumin 4.4 3.5 - 5.0 g/dL   AST 26 15 - 41 U/L   ALT 28 17 - 63 U/L   Alkaline Phosphatase 100 38 - 126 U/L   Total Bilirubin 0.9 0.3 - 1.2 mg/dL   GFR calc non Af Amer >60 >60 mL/min   GFR calc Af Amer >60 >60 mL/min    Comment: (NOTE) The eGFR has been calculated using the CKD EPI equation. This calculation has not been validated in all clinical situations. eGFR's persistently <60 mL/min signify possible Chronic Kidney Disease.    Anion gap 8 5 - 15  Ethanol     Status: Abnormal   Collection Time: 06/02/16  9:58 PM  Result Value Ref Range   Alcohol, Ethyl (B) 99 (H) <5 mg/dL    Comment:        LOWEST DETECTABLE LIMIT FOR SERUM ALCOHOL IS 5 mg/dL FOR MEDICAL PURPOSES ONLY   Salicylate level     Status: None   Collection Time: 06/02/16  9:58 PM  Result Value Ref Range   Salicylate Lvl <4.0 2.8 - 30.0 mg/dL  Acetaminophen level     Status: Abnormal   Collection Time: 06/02/16  9:58 PM  Result Value Ref Range   Acetaminophen (Tylenol), Serum <10 (L) 10 - 30 ug/mL    Comment:        THERAPEUTIC CONCENTRATIONS VARY SIGNIFICANTLY. A RANGE OF 10-30 ug/mL MAY BE AN EFFECTIVE CONCENTRATION FOR MANY PATIENTS. HOWEVER, SOME ARE BEST TREATED AT CONCENTRATIONS OUTSIDE THIS RANGE. ACETAMINOPHEN CONCENTRATIONS >150 ug/mL AT 4 HOURS AFTER INGESTION AND >50 ug/mL AT 12 HOURS AFTER INGESTION ARE OFTEN ASSOCIATED WITH TOXIC REACTIONS.   cbc     Status: None   Collection Time:  06/02/16  9:58 PM  Result Value Ref Range   WBC 9.5 4.0 - 10.5 K/uL   RBC 4.91 4.22 - 5.81 MIL/uL   Hemoglobin 14.2 13.0 - 17.0 g/dL   HCT 40.4 39.0 - 52.0 %   MCV 82.3 78.0 - 100.0 fL   MCH 28.9 26.0 - 34.0 pg   MCHC 35.1 30.0 - 36.0 g/dL   RDW 13.4 11.5 - 15.5 %   Platelets 195 150 - 400 K/uL    Blood Alcohol level:  Lab Results  Component Value Date   ETH 99 (H) 06/02/2016   ETH 41 (H) 97/35/3299    Metabolic Disorder Labs:  No results found for: HGBA1C, MPG No results found for: PROLACTIN No results found for: CHOL, TRIG, HDL, CHOLHDL, VLDL, LDLCALC  Current Medications: Current Facility-Administered Medications  Medication Dose Route Frequency Provider Last Rate Last Dose  . acetaminophen (TYLENOL) tablet 650 mg  650 mg Oral Q6H PRN Rozetta Nunnery, NP      . alum & mag hydroxide-simeth (MAALOX/MYLANTA) 200-200-20 MG/5ML suspension 30 mL  30 mL Oral Q4H PRN Rozetta Nunnery, NP      . amLODipine (NORVASC) tablet 10 mg  10 mg Oral Daily Rozetta Nunnery, NP      . busPIRone (BUSPAR) tablet 7.5 mg  7.5 mg Oral BID Rozetta Nunnery, NP      . hydrOXYzine (ATARAX/VISTARIL) tablet 25 mg  25 mg Oral Q6H PRN Rozetta Nunnery, NP      . loperamide (IMODIUM) capsule 2-4 mg  2-4 mg Oral PRN Rozetta Nunnery, NP      . LORazepam (ATIVAN) tablet 1 mg  1 mg Oral Q6H PRN Rozetta Nunnery, NP      .  magnesium hydroxide (MILK OF MAGNESIA) suspension 30 mL  30 mL Oral Daily PRN Rozetta Nunnery, NP      . multivitamin with minerals tablet 1 tablet  1 tablet Oral Daily Rozetta Nunnery, NP      . ondansetron (ZOFRAN-ODT) disintegrating tablet 4 mg  4 mg Oral Q6H PRN Rozetta Nunnery, NP      . sertraline (ZOLOFT) tablet 50 mg  50 mg Oral Daily Rozetta Nunnery, NP      . Derrill Memo ON 06/04/2016] thiamine (VITAMIN B-1) tablet 100 mg  100 mg Oral Daily Rozetta Nunnery, NP      . traZODone (DESYREL) tablet 50 mg  50 mg Oral QHS PRN Rozetta Nunnery, NP       PTA Medications: Prescriptions Prior to Admission  Medication Sig  Dispense Refill Last Dose  . amLODipine (NORVASC) 10 MG tablet Take 1 tablet (10 mg total) by mouth daily. 30 tablet 0 06/01/2016 at Unknown time  . busPIRone (BUSPAR) 7.5 MG tablet Take 1 tablet (7.5 mg total) by mouth 2 (two) times daily. 60 tablet 0   . hydrOXYzine (ATARAX/VISTARIL) 25 MG tablet Take 1 tablet (25 mg total) by mouth 3 (three) times daily as needed for anxiety. 30 tablet 0 06/01/2016 at Unknown time  . nicotine polacrilex (NICORETTE) 2 MG gum Take 1 each (2 mg total) by mouth as needed for smoking cessation. (Patient not taking: Reported on 05/26/2016) 100 tablet 0 Not Taking at Unknown time  . sertraline (ZOLOFT) 50 MG tablet Take 1 tablet (50 mg total) by mouth daily. 30 tablet 0 06/01/2016 at Unknown time  . traZODone (DESYREL) 50 MG tablet Take 1 tablet (50 mg total) by mouth at bedtime as needed for sleep. 30 tablet 0 05/24/2016 at Unknown time    Musculoskeletal: Strength & Muscle Tone: within normal limits Gait & Station: normal Patient leans: N/A  Psychiatric Specialty Exam: Physical Exam  Constitutional: He is oriented to person, place, and time. He appears well-developed and well-nourished. No distress.  HENT:  Head: Normocephalic and atraumatic.  Right Ear: External ear normal.  Left Ear: External ear normal.  Eyes: Conjunctivae are normal. Pupils are equal, round, and reactive to light. Right eye exhibits no discharge. Left eye exhibits no discharge. No scleral icterus.  Cardiovascular: Normal rate.   Respiratory: Effort normal. No respiratory distress.  Musculoskeletal: Normal range of motion.  Neurological: He is alert and oriented to person, place, and time.  Skin: Skin is warm and dry. He is not diaphoretic.  Psychiatric: His speech is normal and behavior is normal. His mood appears not anxious. His affect is not angry, not blunt and not labile. Thought content is paranoid. Cognition and memory are normal. He expresses impulsivity and inappropriate judgment. He  exhibits a depressed mood. He expresses no homicidal and no suicidal ideation. He expresses no suicidal plans.    Review of Systems  Psychiatric/Behavioral: Positive for depression, substance abuse and suicidal ideas. Negative for hallucinations and memory loss. The patient is not nervous/anxious and does not have insomnia.   All other systems reviewed and are negative.   Blood pressure (!) 147/76, pulse 84, temperature 97.7 F (36.5 C), temperature source Oral, resp. rate 16, height _0  (1.676 m), weight 88 kg (194 lb), SpO2 98 %.There is no height or weight on file to calculate BMI.  General Appearance: Casual  Eye Contact:  Good  Speech:  Clear and Coherent and Normal Rate  Volume:  Normal  Mood:  Depressed, Hopeless and Worthless  Affect:  Congruent  Thought Process:  Coherent  Orientation:  Full (Time, Place, and Person)  Thought Content:  Logical and Hallucinations: None  Suicidal Thoughts:  Currently denies suicidal thoughts.  Homicidal Thoughts:  No  Memory:  Immediate;   Good Recent;   Good Remote;   Good  Judgement:  Fair  Insight:  Lacking  Psychomotor Activity:  Normal  Concentration:  Concentration: Good and Attention Span: Good  Recall:  Good  Fund of Knowledge:  Good  Language:  Good  Akathisia:  NA  Handed:  Right  AIMS (if indicated):     Assets:  Communication Skills Desire for Improvement Physical Health  ADL's:  Intact  Cognition:  WNL  Sleep:         Treatment Plan Summary: Daily contact with patient to assess and evaluate symptoms and progress in treatment and Medication management  CIWA protocol  Observation Level/Precautions:  Continuous Observation Laboratory: Reviewed following labs CBC Chemistry Profile UDS BAL.  UDS negative, BAL 99 Psychotherapy:  Individual Medications:  Buspar 7.5 mg BID for anxiety, Zoloft 50 mg daily for depression, trazodone 50 mg QHS sleep, vistaril 25 mg prn anxiety, Norvasc 10 mg daily hypertension, CIWA  protocol with prn ativan Consultations:  As needed Discharge Concerns: homeless and continued substance abuse  Estimated LOS:24-48 hours Other:      Rozetta Nunnery, NP 1/20/20185:38 AM

## 2016-06-03 NOTE — BH Assessment (Addendum)
Tele Assessment Note   Ralph BaconMichael Anderson is a 46 y.o. male [facility] by [facility]. Pt states [facility] and when he left he still felt S/I. Pt states he thought he could handle the depressed feelings but the symptoms kept increasing. Pt states he had urges tonight to jump out in front of cars in a suicide attempt but his girlfriend was with him and was able to talk to him out of doing it. Pt states that he is homeless and sometimes stays with his girlfriend and his friend. Pt states he feels, "isolated and does not know what his purpose in life is". Pt states these thoughts have made him want to die.   Pt denies H/I and AV hallucinations. Pt reports he sometimes hits himself in order to calm his nerves and regulate himself. Pt also stated he gets really paranoid when he is walking around on the streets because he feels like somebody is always behind him. Pt reports that he has an ongoing hx with drinking alcohol. Pt reports that he drank a couple of beers tonight in an attempt to make himself feel better. Pt reports that he was in treatment at [facility] for over 20 days and left early due to being distracted by his girlfriend. Pt states up until yesterday he did not drink for 2 weeks.   Pt states he does take medications for depression but did not take them today because they were at his friend's house and he does not have access to them currently. PT reports [facility].   Pt was dressed in hospital scrubs. Pt was alert and oriented x4. Pt's mood and affect were depressed. Pt's thought process was impaired and was poor historian of recent events. Pt did not appear to be responding to any internal stimuli.   Diagnosis: MDD, Recurrent, Severe, Alcohol Use Disorder, Moderate   Past Medical History:  Past Medical History:  Diagnosis Date  . Hypertension     History reviewed. No pertinent surgical history.  Family History: No  family history on file.  Social History:  reports that he has been smoking Cigarettes.  He has been smoking about 1.00 pack per day. He has never used smokeless tobacco. He reports that he drinks about 14.4 oz of alcohol per week . He reports that he does not use drugs.  Additional Social History:     CIWA: CIWA-Ar BP: 129/77 Pulse Rate: 88 COWS:    PATIENT STRENGTHS: (choose at least two) Ability for insight Average or above average intelligence Capable of independent living Communication skills General fund of knowledge Motivation for treatment/growth Physical Health  Allergies: No Known Allergies  Home Medications:  (Not in a hospital admission)  OB/GYN Status:  No LMP for male patient.  General Assessment Data Location of Assessment: [facility] TTS Assessment: In system Is this a Tele or Face-to-Face Assessment?: Face-to-Face Is this an Initial Assessment or a Re-assessment for this encounter?: Initial Assessment Marital status: Single Is patient pregnant?: No Pregnancy Status: Unknown Living Arrangements: Other (Comment) (homeless) Can pt return to current living arrangement?: Yes Admission Status: Voluntary Is patient capable of signing voluntary admission?: Yes Referral Source: Self/Family/Friend Insurance type: self pay     Crisis Care Plan Living Arrangements: Other (Comment) (homeless) Name of Psychiatrist: None Name of Therapist: None  Education Status Is patient currently in school?: No Highest grade of school patient has completed: 12th grade Name of school: na  Contact person: na  Risk to self with the past 6 months Suicidal Ideation: Yes-Currently Present Has patient been a risk to self within the past 6 months prior to admission? : Yes Suicidal Intent: Yes-Currently Present Has patient had any suicidal intent within the past 6 months prior to admission? : Yes Is patient at risk for suicide?: Yes Suicidal Plan?: Yes-Currently Present Has patient  had any suicidal plan within the past 6 months prior to admission? : Yes Specify Current Suicidal Plan: jumping in front of cars Access to Means: Yes Specify Access to Suicidal Means: traffic What has been your use of drugs/alcohol within the last 12 months?: ETOH Previous Attempts/Gestures: No How many times?: 0 Other Self Harm Risks: hits himself Triggers for Past Attempts: Unpredictable Intentional Self Injurious Behavior: Damaging (hits self) Comment - Self Injurious Behavior: hitting himself to help calm down Family Suicide History: No Recent stressful life event(s): Other (Comment) (homeless and is paranoid) Persecutory voices/beliefs?: No Depression: Yes Depression Symptoms: Tearfulness, Isolating, Fatigue, Feeling worthless/self pity, Feeling angry/irritable Substance abuse history and/or treatment for substance abuse?: Yes Suicide prevention information given to non-admitted patients: Not applicable  Risk to Others within the past 6 months Homicidal Ideation: No Does patient have any lifetime risk of violence toward others beyond the six months prior to admission? : No Thoughts of Harm to Others: No Current Homicidal Intent: No Current Homicidal Plan: No Access to Homicidal Means: No Identified Victim: na History of harm to others?: No Assessment of Violence: None Noted Violent Behavior Description: na Does patient have access to weapons?: No Criminal Charges Pending?: Yes Describe Pending Criminal Charges: DWI Does patient have a court date: Yes Court Date: 06/21/16 Is patient on probation?: Yes  Psychosis Hallucinations: None noted Delusions: None noted  Mental Status Report Appearance/Hygiene: Unremarkable, In scrubs Eye Contact: Good Motor Activity: Freedom of movement Speech: Logical/coherent Level of Consciousness: Alert Mood: Depressed, Anxious Affect: Sad Anxiety Level: None Thought Processes: Coherent Judgement: Impaired Orientation: Person,  Place, Situation, Time Obsessive Compulsive Thoughts/Behaviors: None  Cognitive Functioning Concentration: Normal Memory: Recent Intact, Remote Intact IQ: Average Insight: Fair Impulse Control: Fair Appetite: Poor Weight Loss: 0 Weight Gain: 0 Sleep: No Change Total Hours of Sleep: 6 Vegetative Symptoms: None  ADLScreening [facility] Assessment Services) Patient's cognitive ability adequate to safely complete daily activities?: Yes Patient able to express need for assistance with ADLs?: Yes Independently performs ADLs?: Yes (appropriate for developmental age)  Prior Inpatient Therapy Prior Inpatient Therapy: Yes Prior Therapy Dates: December 2017 Prior Therapy Facilty/Provider(s): [facility] Reason for Treatment: SI  Prior Outpatient Therapy Prior Outpatient Therapy: No Prior Therapy Dates: N/A Prior Therapy Facilty/Provider(s): NA Reason for Treatment: N/A Does patient have an ACCT team?: No Does patient have Intensive In-House Services?  : No Does patient have [facility] services? : No Does patient have P4CC services?: No  ADL Screening (condition at time of admission) Patient's cognitive ability adequate to safely complete daily activities?: Yes Is the patient deaf or have difficulty hearing?: No Does the patient have difficulty seeing, even when wearing glasses/contacts?: Yes Does the patient have difficulty concentrating, remembering, or making decisions?: No Patient able to express need for assistance with ADLs?: Yes Does the patient have difficulty dressing or bathing?: No Independently performs ADLs?: Yes (appropriate for developmental age) Does the patient have difficulty walking or climbing stairs?: No Weakness of Legs: None Weakness of Arms/Hands: None  Home Assistive Devices/Equipment Home Assistive Devices/Equipment: None    Abuse/Neglect Assessment (Assessment to be complete while patient  is alone) Physical Abuse: Denies Verbal Abuse: Denies Sexual Abuse:  Denies Exploitation of patient/patient's resources: Denies Self-Neglect: Denies Values / Beliefs Cultural Requests During Hospitalization: None Spiritual Requests During Hospitalization: None   Advance Directives (For Healthcare) Does Patient Have a Medical Advance Directive?: No Would patient like information on creating a medical advance directive?: No - Patient declined    Additional Information 1:1 In Past 12 Months?: No CIRT Risk: No Elopement Risk: No Does patient have medical clearance?: Yes     Disposition: Gave clinical report to Nira Conn, FNP who states pt meets criteria for the observational unit at [facility]. TTS will follow up on placement of unit.     Morrie Sheldon n Keshaun Dubey 06/03/2016 1:21 AM

## 2016-06-03 NOTE — Progress Notes (Signed)
Pt in bed sleeping on and off between intervals. Endorsed passive SI with no plan. Denies AVH/HI. Cooperative with care and took meds without incident. Contracted for safety.

## 2016-06-03 NOTE — Progress Notes (Signed)
Patient spoke with the NP regarding his disposition. Patient would like to receive treatment in an alcohol recovery treatment however is concerned that the time commitment will prevent him from trying to get a job and work right away. Patient will continue to be observed in the Observation Unit. His disposition will be reevaluated tomorrow morning. 06/03/2016-Delories Mauri 1:15PM

## 2016-06-03 NOTE — ED Provider Notes (Signed)
Patient has been [facility] by Dr. Lucianne MussKumar.    Dione Boozeavid Daisia Slomski, MD 06/03/16 [phone]

## 2016-06-03 NOTE — Progress Notes (Signed)
[facility] Observation Unit Progress Note  06/03/2016 12:19 PM Ralph Anderson  MRN:  [id number] Subjective:  Ralph Anderson is a 46 year old male admitted to [facility] Observation unit with suicidal ideations and alcohol abuse. Pt presented to the [facility] voluntarily after he reported walking in front of two cars and his girlfriend made him call [employer] to bring him to the hospital. Pt was calm and cooperative, alert & oriented, dressed in paper scrubs, and lying in bed.  Pt stated he has been having increased suicidal thoughts and his plan is to walk in front of traffic. Pt has had 7 ED visits and one [facility] inpatient admission in the past 7 months. Pt stated he needs to get himself straightened out and get a job so he can get his own place. He currently lives either with his girlfriend, a friend, or on the street. Pt recently was at [facility] residential treatment program but left after 20 days due to aggravation with the people there. Pt stated he is really trying hard not to abuse alcohol but had a bad day yesterday and drank. BAL on admission to the [place] was 99, no there substances detected in UDS.  Pt is interested in follow up with [facility] when discharged for therapy and medication management.   Principal Problem: Major depression, recurrent ([place]) Diagnosis:   Patient Active Problem List   Diagnosis Date Noted  . Major depression, recurrent ([place]) [F33.9] 06/03/2016    Priority: High  . Suicidal ideations [R45.851]     Priority: High  . Alcohol use disorder, severe, dependence ([place]) [F10.20] 04/30/2016    Priority: High  . Chronic pain of lower extremity, bilateral [M79.604, M79.605, G89.29]   . MDD (major depressive disorder), recurrent severe, without psychosis ([place]) [F33.2] 04/25/2016   Total Time spent with patient: 15 minutes  Past Psychiatric History: MDD, Substance abuse, Suicidal Ideation  Past Medical History:  Past Medical History:  Diagnosis Date  . Anxiety   . Depression   . Hypertension     History reviewed. No pertinent surgical history. Family History: History reviewed. No pertinent family history. Family Psychiatric  History: Unknown Social History:  History  Alcohol Use  . 3.6 - 7.2 oz/week  . 6 - 12 Cans of beer per week    Comment: He reports weekly use     History  Drug Use No    Comment: Pt stated "it's been 10 yrs since I smoked marijuana"    Social History   Social History  . Marital status: Single    Spouse name: N/A  . Number of children: N/A  . Years of education: N/A   Social History Main Topics  . Smoking status: Current Every Day Smoker    Packs/day: 1.00    Types: Cigarettes  . Smokeless tobacco: Never Used  . Alcohol use 3.6 - 7.2 oz/week    6 - 12 Cans of beer per week     Comment: He reports weekly use  . Drug use: No     Comment: Pt stated "it's been 10 yrs since I smoked marijuana"  . Sexual activity: Yes   Other Topics Concern  . None   Social History Narrative  . None   Additional Social History:    History of alcohol / drug use?: Yes Negative Consequences of Use: Legal, Financial, Personal relationships Name of Substance 1: ETOH 1 - Frequency: reports weekly now; states cut down since [facility]   Sleep: Fair Appetite:  Good  Current Medications: Current  Facility-Administered Medications  Medication Dose Route Frequency Provider Last Rate Last Dose  . acetaminophen (TYLENOL) tablet 650 mg  650 mg Oral Q6H PRN Rozetta Nunnery, NP      . alum & mag hydroxide-simeth (MAALOX/MYLANTA) 200-200-20 MG/5ML suspension 30 mL  30 mL Oral Q4H PRN Rozetta Nunnery, NP      . amLODipine (NORVASC) tablet 10 mg  10 mg Oral Daily Rozetta Nunnery, NP   10 mg at 06/03/16 4383  . busPIRone (BUSPAR) tablet 7.5 mg  7.5 mg Oral BID Rozetta Nunnery, NP   7.5 mg at 06/03/16 8184  . hydrOXYzine (ATARAX/VISTARIL) tablet 25 mg  25 mg Oral Q6H PRN Rozetta Nunnery, NP      . loperamide (IMODIUM) capsule 2-4 mg  2-4 mg Oral PRN Rozetta Nunnery, NP      . LORazepam  (ATIVAN) tablet 1 mg  1 mg Oral Q6H PRN Rozetta Nunnery, NP      . magnesium hydroxide (MILK OF MAGNESIA) suspension 30 mL  30 mL Oral Daily PRN Rozetta Nunnery, NP      . multivitamin with minerals tablet 1 tablet  1 tablet Oral Daily Rozetta Nunnery, NP   1 tablet at 06/03/16 0820  . ondansetron (ZOFRAN-ODT) disintegrating tablet 4 mg  4 mg Oral Q6H PRN Rozetta Nunnery, NP      . sertraline (ZOLOFT) tablet 50 mg  50 mg Oral Daily Rozetta Nunnery, NP   50 mg at 06/03/16 0820  . [START ON 06/04/2016] thiamine (VITAMIN B-1) tablet 100 mg  100 mg Oral Daily Rozetta Nunnery, NP      . traZODone (DESYREL) tablet 50 mg  50 mg Oral QHS PRN Rozetta Nunnery, NP        Lab Results:  Results for orders placed or performed during the hospital encounter of 06/02/16 (from the past 48 hour(s))  Rapid urine drug screen (hospital performed)     Status: None   Collection Time: 06/02/16 12:57 AM  Result Value Ref Range   Opiates NONE DETECTED NONE DETECTED   Cocaine NONE DETECTED NONE DETECTED   Benzodiazepines NONE DETECTED NONE DETECTED   Amphetamines NONE DETECTED NONE DETECTED   Tetrahydrocannabinol NONE DETECTED NONE DETECTED   Barbiturates NONE DETECTED NONE DETECTED    Comment:        DRUG SCREEN FOR MEDICAL PURPOSES ONLY.  IF CONFIRMATION IS NEEDED FOR ANY PURPOSE, NOTIFY LAB WITHIN 5 DAYS.        LOWEST DETECTABLE LIMITS FOR URINE DRUG SCREEN Drug Class       Cutoff (ng/mL) Amphetamine      1000 Barbiturate      200 Benzodiazepine   037 Tricyclics       543 Opiates          300 Cocaine          300 THC              50   Comprehensive metabolic panel     Status: None   Collection Time: 06/02/16  9:58 PM  Result Value Ref Range   Sodium 139 135 - 145 mmol/L   Potassium 3.9 3.5 - 5.1 mmol/L   Chloride 106 101 - 111 mmol/L   CO2 25 22 - 32 mmol/L   Glucose, Bld 95 65 - 99 mg/dL   BUN 15 6 - 20 mg/dL   Creatinine, Ser 0.65 0.61 - 1.24 mg/dL   Calcium 9.2 8.9 -  10.3 mg/dL   Total Protein 6.8 6.5 -  8.1 g/dL   Albumin 4.4 3.5 - 5.0 g/dL   AST 26 15 - 41 U/L   ALT 28 17 - 63 U/L   Alkaline Phosphatase 100 38 - 126 U/L   Total Bilirubin 0.9 0.3 - 1.2 mg/dL   GFR calc non Af Amer >60 >60 mL/min   GFR calc Af Amer >60 >60 mL/min    Comment: (NOTE) The eGFR has been calculated using the CKD EPI equation. This calculation has not been validated in all clinical situations. eGFR's persistently <60 mL/min signify possible Chronic Kidney Disease.    Anion gap 8 5 - 15  Ethanol     Status: Abnormal   Collection Time: 06/02/16  9:58 PM  Result Value Ref Range   Alcohol, Ethyl (B) 99 (H) <5 mg/dL    Comment:        LOWEST DETECTABLE LIMIT FOR SERUM ALCOHOL IS 5 mg/dL FOR MEDICAL PURPOSES ONLY   Salicylate level     Status: None   Collection Time: 06/02/16  9:58 PM  Result Value Ref Range   Salicylate Lvl <1.1 2.8 - 30.0 mg/dL  Acetaminophen level     Status: Abnormal   Collection Time: 06/02/16  9:58 PM  Result Value Ref Range   Acetaminophen (Tylenol), Serum <10 (L) 10 - 30 ug/mL    Comment:        THERAPEUTIC CONCENTRATIONS VARY SIGNIFICANTLY. A RANGE OF 10-30 ug/mL MAY BE AN EFFECTIVE CONCENTRATION FOR MANY PATIENTS. HOWEVER, SOME ARE BEST TREATED AT CONCENTRATIONS OUTSIDE THIS RANGE. ACETAMINOPHEN CONCENTRATIONS >150 ug/mL AT 4 HOURS AFTER INGESTION AND >50 ug/mL AT 12 HOURS AFTER INGESTION ARE OFTEN ASSOCIATED WITH TOXIC REACTIONS.   cbc     Status: None   Collection Time: 06/02/16  9:58 PM  Result Value Ref Range   WBC 9.5 4.0 - 10.5 K/uL   RBC 4.91 4.22 - 5.81 MIL/uL   Hemoglobin 14.2 13.0 - 17.0 g/dL   HCT 40.4 39.0 - 52.0 %   MCV 82.3 78.0 - 100.0 fL   MCH 28.9 26.0 - 34.0 pg   MCHC 35.1 30.0 - 36.0 g/dL   RDW 13.4 11.5 - 15.5 %   Platelets 195 150 - 400 K/uL    Blood Alcohol level:  Lab Results  Component Value Date   ETH 99 (H) 06/02/2016   ETH 41 (H) 91/47/8295    Metabolic Disorder Labs: No results found for: HGBA1C, MPG No results found for:  PROLACTIN No results found for: CHOL, TRIG, HDL, CHOLHDL, VLDL, LDLCALC  Physical Findings: AIMS: Facial and Oral Movements Muscles of Facial Expression: None, normal Lips and Perioral Area: None, normal Jaw: None, normal Tongue: None, normal,Extremity Movements Upper (arms, wrists, hands, fingers): None, normal Lower (legs, knees, ankles, toes): None, normal, Trunk Movements Neck, shoulders, hips: None, normal, Overall Severity Severity of abnormal movements (highest score from questions above): None, normal Incapacitation due to abnormal movements: None, normal Patient's awareness of abnormal movements (rate only patient's report): No Awareness, Dental Status Current problems with teeth and/or dentures?: No Does patient usually wear dentures?: No  CIWA:  CIWA-Ar Total: 1 COWS:     Musculoskeletal: Strength & Muscle Tone: within normal limits Gait & Station: normal Patient leans: N/A  Psychiatric Specialty Exam: Physical Exam  Constitutional: He is oriented to person, place, and time. He appears well-developed and well-nourished.  HENT:  Head: Normocephalic.  Musculoskeletal: Normal range of motion.  Neurological: He is alert  and oriented to person, place, and time.  Skin: Skin is warm and dry.    Review of Systems  Psychiatric/Behavioral: Positive for depression, substance abuse and suicidal ideas. Negative for hallucinations and memory loss. The patient is not nervous/anxious and does not have insomnia.   All other systems reviewed and are negative.   Blood pressure (!) 147/76, pulse 84, temperature 97.7 F (36.5 C), temperature source Oral, resp. rate 16, height 5' 6"  (1.676 m), weight 88 kg (194 lb), SpO2 98 %.Body mass index is 31.31 kg/m.  General Appearance: Casual  Eye Contact:  Good  Speech:  Clear and Coherent and Normal Rate  Volume:  Normal  Mood:  Anxious, Depressed, Hopeless and Worthless  Affect:  Congruent, Depressed and Flat  Thought Process:   Coherent, Goal Directed and Linear  Orientation:  Full (Time, Place, and Person)  Thought Content:  Logical  Suicidal Thoughts:  Yes.  with intent/plan  Homicidal Thoughts:  No  Memory:  Immediate;   Good Recent;   Fair Remote;   Fair  Judgement:  Fair  Insight:  Fair  Psychomotor Activity:  Normal  Concentration:  Concentration: Good and Attention Span: Good  Recall:  Good  Fund of Knowledge:  Good  Language:  Good  Akathisia:  No  Handed:  Right  AIMS (if indicated):     Assets:  Communication Skills Desire for Improvement Housing Physical Health Resilience Social Support  ADL's:  Intact  Cognition:  WNL  Sleep:   Fair     Treatment Plan Summary: Daily contact with patient to assess and evaluate symptoms and progress in treatment and Medication management (see MAR for current medications) Continue to monitor in [facility] Observation unit for 24-48 hours.  Assist Pt with discharge planning    Ethelene Hal, NP 06/03/2016, 12:19 PM

## 2016-06-03 NOTE — Progress Notes (Signed)
Pt discharged ambulatory and alert. VSS. AVS, Rx, personal belongings and bus pass given along with discharge and follow up instructions. Pt verbalized understanding. Denied SI/HI/AVH/Pain and was able to contract for safety prior to discharge. [facility]. No distress noted.

## 2016-06-03 NOTE — BH Assessment (Signed)
Per Rutha BouchardJoAnn, [facility] pt has bed placement, bed-4 in the observational unit. Dr.Kumar will be accepting physician.  Pt can be transferred at anytime.

## 2016-07-22 ENCOUNTER — Encounter (HOSPITAL_COMMUNITY): Payer: Self-pay | Admitting: Emergency Medicine

## 2016-07-22 ENCOUNTER — Emergency Department (HOSPITAL_COMMUNITY)
Admission: EM | Admit: 2016-07-22 | Discharge: 2016-07-22 | Disposition: A | Discharge status: Home / Self Care | Payer: Self-pay | Attending: Dermatology | Admitting: Dermatology

## 2016-07-22 DIAGNOSIS — Z5321 Procedure and treatment not carried out due to patient leaving prior to being seen by health care provider: Secondary | ICD-10-CM | POA: Insufficient documentation

## 2016-07-22 DIAGNOSIS — F17210 Nicotine dependence, cigarettes, uncomplicated: Secondary | ICD-10-CM | POA: Insufficient documentation

## 2016-07-22 DIAGNOSIS — I10 Essential (primary) hypertension: Secondary | ICD-10-CM | POA: Insufficient documentation

## 2016-07-22 DIAGNOSIS — R0781 Pleurodynia: Secondary | ICD-10-CM | POA: Insufficient documentation

## 2016-07-22 NOTE — ED Triage Notes (Addendum)
Pt presents by EMS for evaluation of right rib pain that has been ongoing for the last several week denies any injury. Pt reports drinking three 40oz beer's today. Pt denies any nausea, vomiting, diarrhea.

## 2016-07-22 NOTE — ED Notes (Signed)
Pt c/o right arm numbness/weakness/paresthesia with past hx of the same on the left arm.

## 2016-07-22 NOTE — ED Notes (Signed)
Pt attempted to be placed into room and then stated that he did not want to stay and walked out of department.

## 2016-07-22 NOTE — ED Triage Notes (Signed)
Pt reports also being homeless at this time.

## 2016-07-23 ENCOUNTER — Emergency Department (HOSPITAL_COMMUNITY)
Admission: EM | Admit: 2016-07-23 | Discharge: 2016-07-23 | Disposition: A | Discharge status: Home / Self Care | Payer: Self-pay | Attending: Physician Assistant | Admitting: Physician Assistant

## 2016-07-23 ENCOUNTER — Emergency Department (HOSPITAL_COMMUNITY): Payer: Self-pay

## 2016-07-23 ENCOUNTER — Encounter (HOSPITAL_COMMUNITY): Payer: Self-pay | Admitting: *Deleted

## 2016-07-23 DIAGNOSIS — I10 Essential (primary) hypertension: Secondary | ICD-10-CM | POA: Insufficient documentation

## 2016-07-23 DIAGNOSIS — R202 Paresthesia of skin: Secondary | ICD-10-CM | POA: Insufficient documentation

## 2016-07-23 DIAGNOSIS — R1032 Left lower quadrant pain: Secondary | ICD-10-CM | POA: Insufficient documentation

## 2016-07-23 DIAGNOSIS — F17210 Nicotine dependence, cigarettes, uncomplicated: Secondary | ICD-10-CM | POA: Insufficient documentation

## 2016-07-23 DIAGNOSIS — Z79899 Other long term (current) drug therapy: Secondary | ICD-10-CM | POA: Insufficient documentation

## 2016-07-23 DIAGNOSIS — R0602 Shortness of breath: Secondary | ICD-10-CM | POA: Insufficient documentation

## 2016-07-23 DIAGNOSIS — R0789 Other chest pain: Secondary | ICD-10-CM | POA: Insufficient documentation

## 2016-07-23 LAB — BASIC METABOLIC PANEL
Anion gap: 11 (ref 5–15)
BUN: 11 mg/dL (ref 6–20)
CALCIUM: 8.7 mg/dL — AB (ref 8.9–10.3)
CO2: 21 mmol/L — AB (ref 22–32)
CREATININE: 0.58 mg/dL — AB (ref 0.61–1.24)
Chloride: 105 mmol/L (ref 101–111)
GFR calc Af Amer: >60 mL/min (ref >60)
GLUCOSE: 141 mg/dL — AB (ref 65–99)
Potassium: 3.6 mmol/L (ref 3.5–5.1)
Sodium: 137 mmol/L (ref 135–145)

## 2016-07-23 LAB — CBC
HCT: 43.3 % (ref 39.0–52.0)
Hemoglobin: 14.7 g/dL (ref 13.0–17.0)
MCH: 28.5 pg (ref 26.0–34.0)
MCHC: 33.9 g/dL (ref 30.0–36.0)
MCV: 84.1 fL (ref 78.0–100.0)
Platelets: 189 10*3/uL (ref 150–400)
RBC: 5.15 MIL/uL (ref 4.22–5.81)
RDW: 14.4 % (ref 11.5–15.5)
WBC: 11.2 10*3/uL — ABNORMAL HIGH (ref 4.0–10.5)

## 2016-07-23 LAB — HEPATIC FUNCTION PANEL
ALT: 28 U/L (ref 17–63)
AST: 32 U/L (ref 15–41)
Albumin: 3.6 g/dL (ref 3.5–5.0)
Alkaline Phosphatase: 138 U/L — ABNORMAL HIGH (ref 38–126)
BILIRUBIN DIRECT: 0.1 mg/dL (ref 0.1–0.5)
BILIRUBIN INDIRECT: 0.9 mg/dL (ref 0.3–0.9)
BILIRUBIN TOTAL: 1 mg/dL (ref 0.3–1.2)
Total Protein: 6.3 g/dL — ABNORMAL LOW (ref 6.5–8.1)

## 2016-07-23 LAB — I-STAT TROPONIN, ED
TROPONIN I, POC: 0.01 ng/mL (ref 0.00–0.08)
TROPONIN I, POC: 0 ng/mL (ref 0.00–0.08)

## 2016-07-23 LAB — LIPASE, BLOOD: Lipase: 35 U/L (ref 11–51)

## 2016-07-23 LAB — BRAIN NATRIURETIC PEPTIDE: B Natriuretic Peptide: 13.9 pg/mL (ref 0.0–100.0)

## 2016-07-23 MED ORDER — IBUPROFEN 400 MG PO TABS
400.0000 mg | ORAL_TABLET | Freq: Once | ORAL | Status: AC
  Administered 2016-07-23: 400 mg via ORAL
  Filled 2016-07-23: qty 1

## 2016-07-23 MED ORDER — CYCLOBENZAPRINE HCL 10 MG PO TABS
10.0000 mg | ORAL_TABLET | Freq: Once | ORAL | Status: AC
  Administered 2016-07-23: 10 mg via ORAL
  Filled 2016-07-23: qty 1

## 2016-07-23 MED ORDER — IOPAMIDOL (ISOVUE-370) INJECTION 76%
INTRAVENOUS | Status: AC
  Administered 2016-07-23: 100 mL
  Filled 2016-07-23: qty 100

## 2016-07-23 MED ORDER — IBUPROFEN 400 MG PO TABS: 600.0000 mg | ORAL_TABLET | Freq: Four times a day (QID) | ORAL | 0 refills | Status: DC | PRN

## 2016-07-23 MED ORDER — CYCLOBENZAPRINE HCL 10 MG PO TABS: 10.0000 mg | ORAL_TABLET | Freq: Two times a day (BID) | ORAL | 0 refills | Status: DC | PRN

## 2016-07-23 NOTE — ED Triage Notes (Signed)
Pt states acute onset R sided chest pain and R arm numbness last night.  [facility], but left before he could get triaged d/t becoming upset with Emergency planning/management officerpolice officer.  States some sob and pain on inspiration.

## 2016-07-23 NOTE — Discharge Instructions (Signed)
[facility] follow [facility] with [facility] for visit with a primary doctor You can get your prescriptions filled there as well for low cost or free Return for worsening symptoms

## 2016-07-23 NOTE — ED Provider Notes (Signed)
[facility] DEPT Provider Note   CSN: [id number] Arrival date & time: 07/23/16  1220     History   Chief Complaint Chief Complaint  Patient presents with  . Chest Pain    HPI Ralph Anderson is a 46 y.o. male presenting with right mid axillary rib pain and numbness in his hand in the ulnar distribution. He has had the numbness for a few days which normally resolves when he moves his hand but has not resolved over the past few days. His right sided pain started yesterday and he attempted to be seen at [facility] but left without being triaged. He felt better earlier today and was able to walk and bicycle, but it came back. He reports a history of same on the left side which resolved spontaneously. He was walking down the street when he felt the pain and he had to lean to his right to relieve the pain. He reports a history of hypertension, but has not been compliant with meds for the past month. He reports drinking 2 x 40z alcohol bottle 3 times per week.  HPI  Past Medical History:  Diagnosis Date  . Anxiety   . Depression   . Hypertension     Patient Active Problem List   Diagnosis Date Noted  . Major depression, recurrent (HCC) 06/03/2016  . Chronic pain of lower extremity, bilateral   . Suicidal ideations   . Alcohol use disorder, severe, dependence (HCC) 04/30/2016  . Severe episode of recurrent major depressive disorder, without psychotic features (HCC) 04/25/2016    History reviewed. No pertinent surgical history.     Home Medications    Prior to Admission medications   Medication Sig Start Date End Date Taking? Authorizing Provider  amLODipine (NORVASC) 10 MG tablet Take 1 tablet (10 mg total) by mouth daily. Patient not taking: Reported on 07/23/2016 06/04/16   Laveda Abbe, NP  busPIRone (BUSPAR) 7.5 MG tablet Take 1 tablet (7.5 mg total) by mouth 2 (two) times daily. Patient not taking: Reported on 07/23/2016 06/03/16   Laveda Abbe, NP  sertraline  (ZOLOFT) 50 MG tablet Take 1 tablet (50 mg total) by mouth daily. Patient not taking: Reported on 07/23/2016 06/04/16   Laveda Abbe, NP  traZODone (DESYREL) 50 MG tablet Take 1 tablet (50 mg total) by mouth at bedtime as needed for sleep. Patient not taking: Reported on 07/23/2016 06/03/16   Laveda Abbe, NP    Family History No family history on file.  Social History Social History  Substance Use Topics  . Smoking status: Current Every Day Smoker    Packs/day: 1.00    Types: Cigarettes  . Smokeless tobacco: Never Used  . Alcohol use 3.6 - 7.2 oz/week    6 - 12 Cans of beer per week     Comment: He reports weekly use     Allergies   Eggs or egg-derived products   Review of Systems Review of Systems  Constitutional: Negative for chills and fever.  HENT: Negative for ear pain and sore throat.   Eyes: Negative for pain and visual disturbance.  Respiratory: Positive for shortness of breath. Negative for cough, chest tightness, wheezing and stridor.        On exertion  Cardiovascular: Positive for chest pain. Negative for palpitations.       Lower mid axillary rib pain  Gastrointestinal: Negative for abdominal distention, abdominal pain, blood in stool, nausea and vomiting.  Genitourinary: Negative for dysuria and hematuria.  Musculoskeletal:  Negative for arthralgias and back pain.  Skin: Negative for color change and rash.  Neurological: Negative for seizures and syncope.  All other systems reviewed and are negative.    Physical Exam Updated Vital Signs BP (!) 159/105 (BP Location: Right Arm)   Pulse 106   Temp 98.4 F (36.9 C) (Oral)   Resp 20   SpO2 95%   Physical Exam  Constitutional: He is oriented to person, place, and time. He appears well-developed and well-nourished. No distress.  Patient is afebrile, nontoxic-appearing lying in bed in no acute distress.  HENT:  Head: Normocephalic and atraumatic.  Eyes: Conjunctivae and EOM are normal.  Pupils are equal, round, and reactive to light.  Neck: Normal range of motion. Neck supple.  Cardiovascular: Normal rate, regular rhythm, normal heart sounds and intact distal pulses.   No murmur heard. Pulmonary/Chest: Effort normal and breath sounds normal. No respiratory distress. He has no wheezes. He has no rales. He exhibits tenderness.  Patient reports diffuse tenderness near the mid axillary right ninth to 11th ribs  Abdominal: Soft. He exhibits no distension. There is tenderness. There is no rebound and no guarding.  Patient was apprehensive to palpation of the right upper quadrant left lower quadrant but exam with inconsistencies. No peritoneal signs.  Musculoskeletal: Normal range of motion. He exhibits no edema or deformity.  Patient with 2+ pitting edema bilaterally in lower extremities.  Neurological: He is alert and oriented to person, place, and time. No cranial nerve deficit. He exhibits normal muscle tone. Coordination normal.  Neurologic Exam:   - Mental status: Patient is alert and cooperative. Fluent speech and words are clear. Coherent thought processes and insight is good. Patient is oriented x 4 to person, place, time and event.   - Cranial nerves:  CN III, IV, VI: pupils equally round, reactive to light both direct and conscensual and normal accommodation. Full extra-ocular movement. CN V: motor temporalis and masseter strength intact. CN VII : muscles of facial expression intact. CN X :  midline uvula. XI strength of sternocleidomastoid and trapezius muscles 5/5, XII: tongue is midline when protruded.  - Motor: No involuntary movements. Muscle tone and bulk normal throughout. Muscle strength is 5/5 in bilateral shoulder abduction, elbow flexion and extension, wrist flexion and extension, thumb opposition, grip, hip extension, flexion, leg flexion and extension, ankle dorsiflexion and plantar flexion.  Difficulty keeping right hand middle and ring finger apart when applying  resistance.  - Sensory: Proprioception, light tough sensation intact in all extremities. Patient reported decreased sensation to right hand.  Normal stance and gait Patient observed ambulating to restroom.   Skin: Skin is warm and dry. He is not diaphoretic.  Psychiatric: He has a normal mood and affect.  Nursing note and vitals reviewed.    ED Treatments / Results  Labs (all labs ordered are listed, but only abnormal results are displayed) Labs Reviewed  BASIC METABOLIC PANEL - Abnormal; Notable for the following:       Result Value   CO2 21 (*)    Glucose, Bld 141 (*)    Creatinine, Ser 0.58 (*)    Calcium 8.7 (*)    All other components within normal limits  CBC - Abnormal; Notable for the following:    WBC 11.2 (*)    All other components within normal limits  HEPATIC FUNCTION PANEL - Abnormal; Notable for the following:    Total Protein 6.3 (*)    Alkaline Phosphatase 138 (*)    All  other components within normal limits  BRAIN NATRIURETIC PEPTIDE  LIPASE, BLOOD  I-STAT TROPOININ, ED  I-STAT TROPOININ, ED    EKG  EKG Interpretation  Date/Time:  Sunday July 23 2016 12:29:37 EDT Ventricular Rate:  112 PR Interval:  112 QRS Duration: 102 QT Interval:  344 QTC Calculation: 469 R Axis:   -18 Text Interpretation:  Sinus tachycardia Incomplete right bundle branch block Left ventricular hypertrophy Abnormal ECG Confirmed by ZACKOWSKI  MD, SCOTT [phone]([id number]) on 07/23/2016 3:16:12 PM       Radiology Dg Chest 2 View  Result Date: 07/23/2016 CLINICAL DATA:  Acute right chest pain for 1 day. EXAM: CHEST  2 VIEW COMPARISON:  None. FINDINGS: The cardiomediastinal silhouette is unremarkable. There is no evidence of focal airspace disease, pulmonary edema, suspicious pulmonary nodule/mass, pleural effusion, or pneumothorax. No acute bony abnormalities are identified. IMPRESSION: No active cardiopulmonary disease. Electronically Signed   By: Harmon PierJeffrey  Hu M.D.   On: 07/23/2016 12:59     Ct Angio Chest Pe W And/or Wo Contrast  Result Date: 07/23/2016 CLINICAL DATA:  Chest pain, right arm numbness, leading to the right. Left lower quadrant tenderness. EXAM: CT ANGIOGRAPHY CHEST CT ABDOMEN AND PELVIS WITH CONTRAST TECHNIQUE: Multidetector CT imaging of the chest was performed using the standard protocol during bolus administration of intravenous contrast. Multiplanar CT image reconstructions and MIPs were obtained to evaluate the vascular anatomy. Multidetector CT imaging of the abdomen and pelvis was performed using the standard protocol during bolus administration of intravenous contrast. CONTRAST:  100 mL Isovue 370 COMPARISON:  None FINDINGS: CTA CHEST FINDINGS Cardiovascular: Satisfactory opacification of the pulmonary arteries to the segmental level. No evidence of pulmonary embolism. Normal heart size. No pericardial effusion. No thoracic aortic aneurysm. No thoracic aortic dissection. Mediastinum/Nodes: No enlarged mediastinal, hilar, or axillary lymph nodes. Thyroid gland, trachea, and esophagus demonstrate no significant findings. Lungs/Pleura: Mild right basilar atelectasis. No focal consolidation. No pleural effusion or pneumothorax. Musculoskeletal: No chest wall abnormality. No acute or significant osseous findings. Review of the MIP images confirms the above findings. CT ABDOMEN and PELVIS FINDINGS Hepatobiliary: No focal liver abnormality is seen. No gallstones, gallbladder wall thickening, or biliary dilatation. Pancreas: Unremarkable. No pancreatic ductal dilatation or surrounding inflammatory changes. Spleen: Normal in size without focal abnormality. Adrenals/Urinary Tract: Adrenal glands are unremarkable. Kidneys are normal, without renal calculi, focal lesion, or hydronephrosis. Bladder is unremarkable. Stomach/Bowel: Stomach is within normal limits. Appendix appears normal. No evidence of bowel wall thickening, distention, or inflammatory changes. Mild rectal fecal  impaction. Vascular/Lymphatic: No significant vascular findings are present. No enlarged abdominal or pelvic lymph nodes. Reproductive: Prostate is unremarkable. Other: No abdominal wall hernia or abnormality. No abdominopelvic ascites. Musculoskeletal: No acute osseous abnormality. No lytic or sclerotic osseous lesion. 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 Bilateral foraminal stenosis at L5-S1. Review of the MIP images confirms the above findings. IMPRESSION: 1. No evidence of pulmonary embolus. 2. No thoracic aortic dissection. 3. No thoracic aortic aneurysm. 4. No acute abdominal or pelvic pathology. 5. Rectal fecal impaction. Electronically Signed   By: Elige KoHetal  Patel   On: 07/23/2016 16:59   Ct Abdomen Pelvis W Contrast  Result Date: 07/23/2016 CLINICAL DATA:  Chest pain, right arm numbness, leading to the right. Left lower quadrant tenderness. EXAM: CT ANGIOGRAPHY CHEST CT ABDOMEN AND PELVIS WITH CONTRAST TECHNIQUE: Multidetector CT imaging of the chest was performed using the standard protocol during bolus administration of intravenous contrast.  Multiplanar CT image reconstructions and MIPs were obtained to evaluate the vascular anatomy. Multidetector CT imaging of the abdomen and pelvis was performed using the standard protocol during bolus administration of intravenous contrast. CONTRAST:  100 mL Isovue 370 COMPARISON:  None FINDINGS: CTA CHEST FINDINGS Cardiovascular: Satisfactory opacification of the pulmonary arteries to the segmental level. No evidence of pulmonary embolism. Normal heart size. No pericardial effusion. No thoracic aortic aneurysm. No thoracic aortic dissection. Mediastinum/Nodes: No enlarged mediastinal, hilar, or axillary lymph nodes. Thyroid gland, trachea, and esophagus demonstrate no significant findings. Lungs/Pleura: Mild right basilar atelectasis. No focal consolidation. No pleural effusion or pneumothorax.  Musculoskeletal: No chest wall abnormality. No acute or significant osseous findings. Review of the MIP images confirms the above findings. CT ABDOMEN and PELVIS FINDINGS Hepatobiliary: No focal liver abnormality is seen. No gallstones, gallbladder wall thickening, or biliary dilatation. Pancreas: Unremarkable. No pancreatic ductal dilatation or surrounding inflammatory changes. Spleen: Normal in size without focal abnormality. Adrenals/Urinary Tract: Adrenal glands are unremarkable. Kidneys are normal, without renal calculi, focal lesion, or hydronephrosis. Bladder is unremarkable. Stomach/Bowel: Stomach is within normal limits. Appendix appears normal. No evidence of bowel wall thickening, distention, or inflammatory changes. Mild rectal fecal impaction. Vascular/Lymphatic: No significant vascular findings are present. No enlarged abdominal or pelvic lymph nodes. Reproductive: Prostate is unremarkable. Other: No abdominal wall hernia or abnormality. No abdominopelvic ascites. Musculoskeletal: No acute osseous abnormality. No lytic or sclerotic osseous lesion. 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 Bilateral foraminal stenosis at L5-S1. Review of the MIP images confirms the above findings. IMPRESSION: 1. No evidence of pulmonary embolus. 2. No thoracic aortic dissection. 3. No thoracic aortic aneurysm. 4. No acute abdominal or pelvic pathology. 5. Rectal fecal impaction. Electronically Signed   By: Elige Ko   On: 07/23/2016 16:59    Procedures Procedures (including critical care time)  Medications Ordered in ED Medications  iopamidol (ISOVUE-370) 76 % injection (100 mLs  Contrast Given 07/23/16 1621)     Initial Impression / Assessment and Plan / ED Course  I have reviewed the triage vital signs and the nursing notes.  Pertinent labs & imaging results that were available during my care of the patient were reviewed by me and  considered in my medical decision making (see chart for details).    Patient presents with sudden onset right-sided rib pain tender to palpation. On exam he has bilateral pitting edema with sob on exertion. cxr without acute cardiopulmonary disease. EKG with RBBB and Lt ventricular hypertrophy but unchanged from previous EKG. Non-compliant with meds. Although he did not report abdominal pain, he was apprehensive to palpation of RUQ and LLQ although inconsistent. Added lipase and hepatic panel to labs.  Discussed patient with Dr. Deretha Emory who recommends CTa and Ct abd and pelvis and agrees with assessment and plan. Initial troponin negative Heart score: 2  Rule out PE, or acute intra-abdominal process, delta troponin. Transferred patient care at end of shift to [facility] pending labs and imaging. Anticipate DC home if all negative with cardiology and PCP follow up.  Final Clinical Impressions(s) / ED Diagnoses   Final diagnoses:  Chest wall pain    New Prescriptions New Prescriptions   No medications on file     Georgiana Shore, Cordelia Poche 07/23/16 1725    Vanetta Mulders, MD 07/27/16 [phone]

## 2016-07-23 NOTE — ED Provider Notes (Signed)
Patient signed out to me by Antony MaduraJ Mitchell PA-C at shift change. He is a 46 year old male who presents with right sided chest wall pain and numbness/tingling in the R hand. CT chest and abdomen, labs, and delta trop are pending.  CBC remarkable for mild leukocytosis. BMP remarkable for hyperglycemia. LFTs unremarkable. Lipase is normal. BNP 13.9. Delta trop is negative. Heart score is 2. CT chest and abdomen overall negative. There is mention of fecal impaction however pt states he has had a BM since being in the ED and does not feel constipated. Pain treated and he is given wrist splint for likely ulnar nerve compression. [facility]. He verbalized understanding. Return precautions.   Bethel BornKelly Marie Harika Laidlaw, PA-C 07/24/16 0019    Courteney Randall AnLyn Mackuen, MD 07/28/16 2125

## 2016-08-25 ENCOUNTER — Emergency Department (HOSPITAL_COMMUNITY)
Admission: EM | Admit: 2016-08-25 | Discharge: 2016-08-27 | Disposition: A | Discharge status: Other Acute Inpatient Hospital | Payer: Federal, State, Local not specified - Other | Attending: Emergency Medicine | Admitting: Emergency Medicine

## 2016-08-25 ENCOUNTER — Encounter (HOSPITAL_COMMUNITY): Payer: Self-pay | Admitting: *Deleted

## 2016-08-25 DIAGNOSIS — Z5181 Encounter for therapeutic drug level monitoring: Secondary | ICD-10-CM | POA: Insufficient documentation

## 2016-08-25 DIAGNOSIS — F1010 Alcohol abuse, uncomplicated: Secondary | ICD-10-CM | POA: Insufficient documentation

## 2016-08-25 DIAGNOSIS — I10 Essential (primary) hypertension: Secondary | ICD-10-CM | POA: Insufficient documentation

## 2016-08-25 DIAGNOSIS — R45850 Homicidal ideations: Secondary | ICD-10-CM | POA: Insufficient documentation

## 2016-08-25 DIAGNOSIS — F17210 Nicotine dependence, cigarettes, uncomplicated: Secondary | ICD-10-CM | POA: Insufficient documentation

## 2016-08-25 DIAGNOSIS — F339 Major depressive disorder, recurrent, unspecified: Secondary | ICD-10-CM | POA: Diagnosis present

## 2016-08-25 DIAGNOSIS — R45851 Suicidal ideations: Secondary | ICD-10-CM

## 2016-08-25 LAB — COMPREHENSIVE METABOLIC PANEL
ALT: 19 U/L (ref 17–63)
ANION GAP: 10 (ref 5–15)
AST: 23 U/L (ref 15–41)
Albumin: 3.7 g/dL (ref 3.5–5.0)
Alkaline Phosphatase: 98 U/L (ref 38–126)
BUN: 9 mg/dL (ref 6–20)
CALCIUM: 8.6 mg/dL — AB (ref 8.9–10.3)
CO2: 23 mmol/L (ref 22–32)
Chloride: 108 mmol/L (ref 101–111)
Creatinine, Ser: 0.65 mg/dL (ref 0.61–1.24)
GFR calc Af Amer: >60 mL/min (ref >60)
GLUCOSE: 93 mg/dL (ref 65–99)
POTASSIUM: 3.6 mmol/L (ref 3.5–5.1)
Sodium: 141 mmol/L (ref 135–145)
Total Bilirubin: 1 mg/dL (ref 0.3–1.2)
Total Protein: 6.7 g/dL (ref 6.5–8.1)

## 2016-08-25 LAB — CBC WITH DIFFERENTIAL/PLATELET
Basophils Absolute: 0 10*3/uL (ref 0.0–0.1)
Basophils Relative: 0 %
Eosinophils Absolute: 0.2 10*3/uL (ref 0.0–0.7)
Eosinophils Relative: 2 %
HEMATOCRIT: 43.2 % (ref 39.0–52.0)
Hemoglobin: 14.3 g/dL (ref 13.0–17.0)
LYMPHS PCT: 27 %
Lymphs Abs: 2.2 10*3/uL (ref 0.7–4.0)
MCH: 28.3 pg (ref 26.0–34.0)
MCHC: 33.1 g/dL (ref 30.0–36.0)
MCV: 85.4 fL (ref 78.0–100.0)
Monocytes Absolute: 0.6 10*3/uL (ref 0.1–1.0)
Monocytes Relative: 7 %
NEUTROS ABS: 5.3 10*3/uL (ref 1.7–7.7)
Neutrophils Relative %: 64 %
Platelets: 187 10*3/uL (ref 150–400)
RBC: 5.06 MIL/uL (ref 4.22–5.81)
RDW: 14.9 % (ref 11.5–15.5)
WBC: 8.3 10*3/uL (ref 4.0–10.5)

## 2016-08-25 LAB — ACETAMINOPHEN LEVEL: Acetaminophen (Tylenol), Serum: <10 ug/mL — ABNORMAL LOW (ref 10–30)

## 2016-08-25 LAB — ETHANOL: Alcohol, Ethyl (B): 53 mg/dL — ABNORMAL HIGH (ref <5)

## 2016-08-25 LAB — SALICYLATE LEVEL: Salicylate Lvl: <7 mg/dL (ref 2.8–30.0)

## 2016-08-25 MED ORDER — LORAZEPAM 1 MG PO TABS
0.0000 mg | ORAL_TABLET | Freq: Four times a day (QID) | ORAL | Status: DC
  Administered 2016-08-26 – 2016-08-27 (×3): 1 mg via ORAL
  Filled 2016-08-25 (×4): qty 1

## 2016-08-25 MED ORDER — VITAMIN B-1 100 MG PO TABS
100.0000 mg | ORAL_TABLET | Freq: Every day | ORAL | Status: DC
Start: 2016-08-26 — End: 2016-08-27
  Administered 2016-08-26 – 2016-08-27 (×3): 100 mg via ORAL
  Filled 2016-08-25 (×3): qty 1

## 2016-08-25 MED ORDER — LORAZEPAM 1 MG PO TABS: 0.0000 mg | ORAL_TABLET | Freq: Two times a day (BID) | ORAL | Status: DC

## 2016-08-25 MED ORDER — THIAMINE HCL 100 MG/ML IJ SOLN: 100.0000 mg | Freq: Every day | INTRAMUSCULAR | Status: DC
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
## 2016-08-25 NOTE — ED Triage Notes (Signed)
Pt has tingling and pain in the right hand, on and off for about 1 year. He states he has been seen previously in the ED for the same, has been using the brace as directed. Symptoms not improved.  Pt also states he would like ETOH detox. Pt has been 4 40oz beers this week, "more than normal" Pt states he has been going to [facility], but wants further resources. Pt denies SI or HI at the present time.
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
## 2016-08-26 DIAGNOSIS — F332 Major depressive disorder, recurrent severe without psychotic features: Secondary | ICD-10-CM | POA: Diagnosis not present

## 2016-08-26 DIAGNOSIS — Z79899 Other long term (current) drug therapy: Secondary | ICD-10-CM

## 2016-08-26 DIAGNOSIS — R45851 Suicidal ideations: Secondary | ICD-10-CM

## 2016-08-26 DIAGNOSIS — F17210 Nicotine dependence, cigarettes, uncomplicated: Secondary | ICD-10-CM | POA: Diagnosis not present

## 2016-08-26 LAB — URINALYSIS, ROUTINE W REFLEX MICROSCOPIC
BILIRUBIN URINE: NEGATIVE
Glucose, UA: NEGATIVE mg/dL
Hgb urine dipstick: NEGATIVE
Ketones, ur: 5 mg/dL — AB
Leukocytes, UA: NEGATIVE
Nitrite: NEGATIVE
PROTEIN: NEGATIVE mg/dL
Specific Gravity, Urine: 1.021 (ref 1.005–1.030)
pH: 5 (ref 5.0–8.0)

## 2016-08-26 LAB — RAPID URINE DRUG SCREEN, HOSP PERFORMED
Amphetamines: NOT DETECTED
Barbiturates: NOT DETECTED
Benzodiazepines: NOT DETECTED
Cocaine: NOT DETECTED
OPIATES: NOT DETECTED
Tetrahydrocannabinol: NOT DETECTED

## 2016-08-26 MED ORDER — HYDROXYZINE HCL 25 MG PO TABS: 25.0000 mg | ORAL_TABLET | Freq: Three times a day (TID) | ORAL | Status: DC | PRN

## 2016-08-26 MED ORDER — TRAZODONE HCL 50 MG PO TABS: 50.0000 mg | ORAL_TABLET | Freq: Every evening | ORAL | Status: DC | PRN

## 2016-08-26 MED ORDER — LORAZEPAM 1 MG PO TABS
0.0000 mg | ORAL_TABLET | Freq: Two times a day (BID) | ORAL | Status: DC
  Administered 2016-08-26: 1 mg via ORAL

## 2016-08-26 MED ORDER — SERTRALINE HCL 50 MG PO TABS
50.0000 mg | ORAL_TABLET | Freq: Every day | ORAL | Status: DC
  Administered 2016-08-26 – 2016-08-27 (×2): 50 mg via ORAL
  Filled 2016-08-26 (×2): qty 1

## 2016-08-26 MED ORDER — LORAZEPAM 1 MG PO TABS: 0.0000 mg | ORAL_TABLET | Freq: Four times a day (QID) | ORAL | Status: DC

## 2016-08-26 NOTE — BH Assessment (Signed)
Writer discussed patient with [facility] Rounding Physician, Dr. Arti Kapur, patient will be admitted to [facility]. Patient's information forwarded to [facility] Charge Nurse, Phyllis C. Pending bed assessment at this time. Writer informed [facility] AC, Lindsay. 

## 2016-08-26 NOTE — ED Notes (Signed)
IV team in room  

## 2016-08-26 NOTE — BH Assessment (Signed)
Writer made several attempts to contact [facility] in order to help coordinate patient transfer to [facility] and was unable to reach them. Spoke with [facility] (Mary) and reported they may be in with another patient completing a assessment. 

## 2016-08-26 NOTE — ED Notes (Signed)
Pt given a shower by Alvino Chapel, CNA his sitter.
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
## 2016-08-26 NOTE — BHH Counselor (Signed)
Accepted to [facility] per AC Lindsay.   

## 2016-08-26 NOTE — Clinical Social Work Note (Signed)
[facility] is looking into an inpatient bed today.  Elray Buba, LCSW [facility] Clinical Social Worker - Weekend coverage

## 2016-08-26 NOTE — BH Assessment (Signed)
Patient pre-admitted by [facility] Patient Access ([place]) 
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
## 2016-08-27 ENCOUNTER — Inpatient Hospital Stay
Admission: AD | Admit: 2016-08-27 | Discharge: 2016-09-01 | DRG: 885 | Disposition: A | Discharge status: Home / Self Care | Payer: No Typology Code available for payment source | Source: Intra-hospital | Attending: Psychiatry | Admitting: Psychiatry

## 2016-08-27 DIAGNOSIS — F419 Anxiety disorder, unspecified: Secondary | ICD-10-CM | POA: Diagnosis present

## 2016-08-27 DIAGNOSIS — R45851 Suicidal ideations: Secondary | ICD-10-CM | POA: Diagnosis present

## 2016-08-27 DIAGNOSIS — Z9119 Patient's noncompliance with other medical treatment and regimen: Secondary | ICD-10-CM

## 2016-08-27 DIAGNOSIS — Y902 Blood alcohol level of 40-59 mg/100 ml: Secondary | ICD-10-CM | POA: Diagnosis present

## 2016-08-27 DIAGNOSIS — Z79899 Other long term (current) drug therapy: Secondary | ICD-10-CM

## 2016-08-27 DIAGNOSIS — Z653 Problems related to other legal circumstances: Secondary | ICD-10-CM

## 2016-08-27 DIAGNOSIS — G8929 Other chronic pain: Secondary | ICD-10-CM | POA: Diagnosis present

## 2016-08-27 DIAGNOSIS — I10 Essential (primary) hypertension: Secondary | ICD-10-CM | POA: Diagnosis present

## 2016-08-27 DIAGNOSIS — F332 Major depressive disorder, recurrent severe without psychotic features: Secondary | ICD-10-CM | POA: Diagnosis not present

## 2016-08-27 DIAGNOSIS — F4323 Adjustment disorder with mixed anxiety and depressed mood: Secondary | ICD-10-CM | POA: Diagnosis present

## 2016-08-27 DIAGNOSIS — F10239 Alcohol dependence with withdrawal, unspecified: Secondary | ICD-10-CM | POA: Diagnosis present

## 2016-08-27 DIAGNOSIS — F4321 Adjustment disorder with depressed mood: Secondary | ICD-10-CM | POA: Insufficient documentation

## 2016-08-27 DIAGNOSIS — G4700 Insomnia, unspecified: Secondary | ICD-10-CM | POA: Diagnosis present

## 2016-08-27 DIAGNOSIS — Z590 Homelessness: Secondary | ICD-10-CM

## 2016-08-27 DIAGNOSIS — F10939 Alcohol use, unspecified with withdrawal, unspecified: Secondary | ICD-10-CM | POA: Diagnosis present

## 2016-08-27 DIAGNOSIS — M25531 Pain in right wrist: Secondary | ICD-10-CM | POA: Diagnosis present

## 2016-08-27 DIAGNOSIS — F1024 Alcohol dependence with alcohol-induced mood disorder: Secondary | ICD-10-CM | POA: Diagnosis present

## 2016-08-27 DIAGNOSIS — F17210 Nicotine dependence, cigarettes, uncomplicated: Secondary | ICD-10-CM | POA: Diagnosis present

## 2016-08-27 DIAGNOSIS — F17200 Nicotine dependence, unspecified, uncomplicated: Secondary | ICD-10-CM | POA: Diagnosis present

## 2016-08-27 DIAGNOSIS — G589 Mononeuropathy, unspecified: Secondary | ICD-10-CM | POA: Diagnosis present

## 2016-08-27 MED ORDER — CYCLOBENZAPRINE HCL 10 MG PO TABS
10.0000 mg | ORAL_TABLET | Freq: Two times a day (BID) | ORAL | Status: DC | PRN
  Administered 2016-08-27 – 2016-09-01 (×6): 10 mg via ORAL
  Filled 2016-08-27 (×7): qty 1

## 2016-08-27 MED ORDER — TRAZODONE HCL 50 MG PO TABS
ORAL_TABLET | ORAL | Status: AC
  Filled 2016-08-27: qty 2

## 2016-08-27 MED ORDER — AMLODIPINE BESYLATE 5 MG PO TABS
5.0000 mg | ORAL_TABLET | Freq: Every day | ORAL | Status: DC
  Administered 2016-08-27 – 2016-09-01 (×6): 5 mg via ORAL
  Filled 2016-08-27 (×6): qty 1

## 2016-08-27 MED ORDER — VITAMIN B-1 100 MG PO TABS
100.0000 mg | ORAL_TABLET | Freq: Every day | ORAL | Status: DC
  Administered 2016-08-28 – 2016-08-30 (×3): 100 mg via ORAL
  Filled 2016-08-27 (×3): qty 1

## 2016-08-27 MED ORDER — MAGNESIUM HYDROXIDE 400 MG/5ML PO SUSP: 30.0000 mL | Freq: Every day | ORAL | Status: DC | PRN

## 2016-08-27 MED ORDER — TRAZODONE HCL 100 MG PO TABS
100.0000 mg | ORAL_TABLET | Freq: Every evening | ORAL | Status: DC | PRN
  Administered 2016-08-27 – 2016-08-31 (×3): 100 mg via ORAL
  Filled 2016-08-27 (×2): qty 1

## 2016-08-27 MED ORDER — SERTRALINE HCL 25 MG PO TABS
50.0000 mg | ORAL_TABLET | Freq: Every day | ORAL | Status: DC
  Administered 2016-08-27 – 2016-08-31 (×5): 50 mg via ORAL
  Filled 2016-08-27 (×5): qty 2

## 2016-08-27 MED ORDER — LORAZEPAM 2 MG PO TABS
0.0000 mg | ORAL_TABLET | Freq: Four times a day (QID) | ORAL | Status: DC
  Administered 2016-08-27: 1 mg via ORAL
  Filled 2016-08-27: qty 1
  Filled 2016-08-27: qty 2

## 2016-08-27 MED ORDER — IBUPROFEN 600 MG PO TABS
600.0000 mg | ORAL_TABLET | Freq: Four times a day (QID) | ORAL | Status: DC | PRN
  Administered 2016-08-27 – 2016-09-01 (×6): 600 mg via ORAL
  Filled 2016-08-27 (×6): qty 1

## 2016-08-27 MED ORDER — ALUM & MAG HYDROXIDE-SIMETH 200-200-20 MG/5ML PO SUSP: 30.0000 mL | ORAL | Status: DC | PRN

## 2016-08-27 MED ORDER — ACETAMINOPHEN 325 MG PO TABS: 650.0000 mg | ORAL_TABLET | Freq: Four times a day (QID) | ORAL | Status: DC | PRN

## 2016-08-27 MED ORDER — NICOTINE 14 MG/24HR TD PT24
14.0000 mg | MEDICATED_PATCH | Freq: Every day | TRANSDERMAL | Status: DC
  Administered 2016-08-27 – 2016-09-01 (×6): 14 mg via TRANSDERMAL
  Filled 2016-08-27 (×6): qty 1

## 2016-08-27 MED ORDER — LORAZEPAM 2 MG PO TABS: 0.0000 mg | ORAL_TABLET | Freq: Two times a day (BID) | ORAL | Status: DC

## 2016-08-27 MED ORDER — THIAMINE HCL 100 MG/ML IJ SOLN
100.0000 mg | Freq: Every day | INTRAMUSCULAR | Status: DC
Start: 2016-08-28 — End: 2016-08-30
  Filled 2016-08-27 (×2): qty 1

## 2016-08-27 NOTE — Progress Notes (Signed)
Patient ID: Ralph Anderson, male   DOB: 25-Jun-1970, 47 y.o.   MRN: [id number] Patient calm and cooperative during admission interview and assessment. VS monitored and recorded. Skin check performed with no skin issues revealed . Pt wearing wrist brace for wrist pain. Contraband was not found. Patient was oriented to unit and schedule. Pt denies SI/HI/AVH at this time. PO fluids provided. Safety maintained with 15 minute checks

## 2016-08-27 NOTE — Tx Team (Signed)
Initial Treatment Plan 08/27/2016 4:12 PM [name] DGU:[id number]    PATIENT STRESSORS: Financial difficulties Health problems Legal issue Medication change or noncompliance Other: alcohol abuse   PATIENT STRENGTHS: Active sense of humor Average or above average intelligence Capable of independent living   PATIENT IDENTIFIED PROBLEMS: Homeless   Alcohol abuse  depression  anxiety  Financial   Relationship            DISCHARGE CRITERIA:  Ability to meet basic life and health needs Adequate post-discharge living arrangements Improved stabilization in mood, thinking, and/or behavior  PRELIMINARY DISCHARGE PLAN: Attend aftercare/continuing care group Outpatient therapy Placement in alternative living arrangements  PATIENT/FAMILY INVOLVEMENT: This treatment plan has been presented to and reviewed with the patient, Ralph Anderson, .  The patient has been given the opportunity to ask questions and make suggestions.  Shela Nevin, RN 08/27/2016, 4:12 PM

## 2016-08-27 NOTE — Progress Notes (Signed)
CSW faxed patient's IVC paperwork to [facility].   Celso Sickle, LCSWA [facility]  Clinical Social Worker [phone]
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
## 2016-08-27 NOTE — ED Notes (Signed)
Bed: [facility] Expected date:  Expected time:  Means of arrival:  Comments: Held for [employer]

## 2016-08-27 NOTE — ED Provider Notes (Signed)
Patient was seen here for hand pain, depression and suicidal thoughts. Medically cleared previously. Has been awaiting placement. A bed has been obtained at [facility] behavioral health under Dr. Maryruth Bun. Patient evaluated prior to transfer with no acute complaints. Safe for transfer.   Nira Conn, MD 08/27/16 1041

## 2016-08-27 NOTE — [facility] Group Notes (Signed)
[facility] Group Notes:  (Nursing/MHT/Case Management/Adjunct)  Date:  08/27/2016  Time:  9:24 PM  Type of Therapy:  Evening Wrap-up Group  Participation Level:  Active  Participation Quality:  Appropriate and Attentive  Affect:  Appropriate  Cognitive:  Alert and Appropriate  Insight:  Good and Improving  Engagement in Group:  Developing/Improving and Engaged  Modes of Intervention:  Discussion  Summary of Progress/Problems:  Tomasita Morrow 08/27/2016, 9:24 PM
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
## 2016-08-27 NOTE — [facility] Group Notes (Signed)
[facility] LCSW Group Therapy  08/27/2016 2:41 PM  Type of Therapy:  Group Therapy  Participation Level:  Patient did not attend group on this date, Patient was being admitted to unit during group time.   Summary of Progress/Problems: Self-responsibility/accountability- Patients discussed self responsibility/accountability  and how it impacts them. Patients were asked to define these concepts in their own words. They discussed taking ownership of their actions and the challenges they have with taking accountability for their self. CSW discussed how to improve their communication with others. Examples were provided of each. They were challenged to identify changes that are needed in order to improve self responsibility/accountability. CSW provided inspirational quotes that focused on the patients taking accountability for actions both good and bad.  Ralph Anderson G. Garnette Czech MSW, LCSWA 08/27/2016, 2:42 PM

## 2016-08-28 DIAGNOSIS — F17200 Nicotine dependence, unspecified, uncomplicated: Secondary | ICD-10-CM | POA: Diagnosis present

## 2016-08-28 LAB — TSH: TSH: 3.061 u[IU]/mL (ref 0.350–4.500)

## 2016-08-28 MED ORDER — CHLORDIAZEPOXIDE HCL 10 MG PO CAPS
10.0000 mg | ORAL_CAPSULE | Freq: Four times a day (QID) | ORAL | Status: DC
  Administered 2016-08-28 – 2016-08-29 (×4): 10 mg via ORAL
  Filled 2016-08-28 (×4): qty 1

## 2016-08-28 NOTE — Tx Team (Signed)
Interdisciplinary Treatment and Diagnostic Plan Update  08/28/2016 Time of Session: 10:30 AM Ralph Anderson MRN: [id number]  Principal Diagnosis: Severe episode of recurrent major depressive disorder, without psychotic features (HCC)  Secondary Diagnoses: Principal Problem:   Severe episode of recurrent major depressive disorder, without psychotic features (HCC) Active Problems:   Alcohol dependence with alcohol-induced mood disorder (HCC)   Suicidal ideations   Adjustment disorder with mixed anxiety and depressed mood   HTN (hypertension)   Tobacco use disorder   Current Medications:  Current Facility-Administered Medications  Medication Dose Route Frequency Provider Last Rate Last Dose  . acetaminophen (TYLENOL) tablet 650 mg  650 mg Oral Q6H PRN Darliss Ridgel, MD      . alum & mag hydroxide-simeth (MAALOX/MYLANTA) 200-200-20 MG/5ML suspension 30 mL  30 mL Oral Q4H PRN Darliss Ridgel, MD      . amLODipine (NORVASC) tablet 5 mg  5 mg Oral Daily Darliss Ridgel, MD   5 mg at 08/28/16 0748  . chlordiazePOXIDE (LIBRIUM) capsule 10 mg  10 mg Oral QID Jolanta B Pucilowska, MD      . cyclobenzaprine (FLEXERIL) tablet 10 mg  10 mg Oral BID PRN Darliss Ridgel, MD   10 mg at 08/28/16 1018  . ibuprofen (ADVIL,MOTRIN) tablet 600 mg  600 mg Oral Q6H PRN Darliss Ridgel, MD   600 mg at 08/28/16 0750  . magnesium hydroxide (MILK OF MAGNESIA) suspension 30 mL  30 mL Oral Daily PRN Darliss Ridgel, MD      . nicotine (NICODERM CQ - dosed in mg/24 hours) patch 14 mg  14 mg Transdermal Daily Darliss Ridgel, MD   14 mg at 08/28/16 0753  . sertraline (ZOLOFT) tablet 50 mg  50 mg Oral Daily Darliss Ridgel, MD   50 mg at 08/28/16 0748  . thiamine (VITAMIN B-1) tablet 100 mg  100 mg Oral Daily Darliss Ridgel, MD   100 mg at 08/28/16 7829   Or  . thiamine (B-1) injection 100 mg  100 mg Intravenous Daily Darliss Ridgel, MD      . traZODone (DESYREL) tablet 100 mg  100 mg Oral QHS PRN Darliss Ridgel, MD   100 mg at 08/27/16  2144   PTA Medications: Prescriptions Prior to Admission  Medication Sig Dispense Refill Last Dose  . cyclobenzaprine (FLEXERIL) 10 MG tablet Take 1 tablet (10 mg total) by mouth 2 (two) times daily as needed for muscle spasms. (Patient not taking: Reported on 08/25/2016) 20 tablet 0 Not Taking at Unknown time  . ibuprofen (ADVIL,MOTRIN) 400 MG tablet Take 1.5 tablets (600 mg total) by mouth every 6 (six) hours as needed. (Patient not taking: Reported on 08/25/2016) 20 tablet 0 Not Taking at Unknown time    Patient Stressors: Financial difficulties Health problems Legal issue Medication change or noncompliance Other: alcohol abuse  Patient Strengths: Active sense of humor Average or above average intelligence Capable of independent living  Treatment Modalities: Medication Management, Group therapy, Case management,  1 to 1 session with clinician, Psychoeducation, Recreational therapy.   Physician Treatment Plan for Primary Diagnosis: Severe episode of recurrent major depressive disorder, without psychotic features (HCC) Long Term Goal(s): Improvement in symptoms so as ready for discharge Improvement in symptoms so as ready for discharge   Short Term Goals: Ability to verbalize feelings will improve Ability to disclose and discuss suicidal ideas Ability to identify and develop effective coping behaviors will improve Compliance with prescribed medications will improve  Ability to identify triggers associated with substance abuse/mental health issues will improve Ability to identify changes in lifestyle to reduce recurrence of condition will improve Ability to verbalize feelings will improve Ability to disclose and discuss suicidal ideas Ability to identify and develop effective coping behaviors will improve Ability to identify triggers associated with substance abuse/mental health issues will improve  Medication Management: Evaluate patient's response, side effects, and tolerance of  medication regimen.  Therapeutic Interventions: 1 to 1 sessions, Unit Group sessions and Medication administration.  Evaluation of Outcomes: Progressing  Physician Treatment Plan for Secondary Diagnosis: Principal Problem:   Severe episode of recurrent major depressive disorder, without psychotic features (HCC) Active Problems:   Alcohol dependence with alcohol-induced mood disorder (HCC)   Suicidal ideations   Adjustment disorder with mixed anxiety and depressed mood   HTN (hypertension)   Tobacco use disorder  Long Term Goal(s): Improvement in symptoms so as ready for discharge Improvement in symptoms so as ready for discharge   Short Term Goals: Ability to verbalize feelings will improve Ability to disclose and discuss suicidal ideas Ability to identify and develop effective coping behaviors will improve Compliance with prescribed medications will improve Ability to identify triggers associated with substance abuse/mental health issues will improve Ability to identify changes in lifestyle to reduce recurrence of condition will improve Ability to verbalize feelings will improve Ability to disclose and discuss suicidal ideas Ability to identify and develop effective coping behaviors will improve Ability to identify triggers associated with substance abuse/mental health issues will improve     Medication Management: Evaluate patient's response, side effects, and tolerance of medication regimen.  Therapeutic Interventions: 1 to 1 sessions, Unit Group sessions and Medication administration.  Evaluation of Outcomes: Progressing   RN Treatment Plan for Primary Diagnosis: Severe episode of recurrent major depressive disorder, without psychotic features (HCC) Long Term Goal(s): Knowledge of disease and therapeutic regimen to maintain health will improve  Short Term Goals: Ability to remain free from injury will improve, Ability to verbalize feelings will improve and Compliance with  prescribed medications will improve  Medication Management: RN will administer medications as ordered by provider, will assess and evaluate patient's response and provide education to patient for prescribed medication. RN will report any adverse and/or side effects to prescribing provider.  Therapeutic Interventions: 1 on 1 counseling sessions, Psychoeducation, Medication administration, Evaluate responses to treatment, Monitor vital signs and CBGs as ordered, Perform/monitor CIWA, COWS, AIMS and Fall Risk screenings as ordered, Perform wound care treatments as ordered.  Evaluation of Outcomes: Progressing   LCSW Treatment Plan for Primary Diagnosis: Severe episode of recurrent major depressive disorder, without psychotic features (HCC) Long Term Goal(s): Safe transition to appropriate next level of care at discharge, Engage patient in therapeutic group addressing interpersonal concerns.  Short Term Goals: Engage patient in aftercare planning with referrals and resources, Increase social support and Identify triggers associated with mental health/substance abuse issues  Therapeutic Interventions: Assess for all discharge needs, 1 to 1 time with Social worker, Explore available resources and support systems, Assess for adequacy in community support network, Educate family and significant other(s) on suicide prevention, Complete Psychosocial Assessment, Interpersonal group therapy.  Evaluation of Outcomes: Progressing   Progress in Treatment: Attending groups: Yes. Participating in groups: Yes. Taking medication as prescribed: Yes. Toleration medication: Yes. Family/Significant other contact made: No, will contact:  CSW assessing proper family contact. Patient understands diagnosis: Yes. Discussing patient identified problems/goals with staff: Yes. Medical problems stabilized or resolved: Yes. Denies suicidal/homicidal ideation: Yes.  Issues/concerns per patient self-inventory: No.  New  problem(s) identified: No, Describe:  None identified.  New Short Term/Long Term Goal(s):  Discharge Plan or Barriers: CSW assessing proper aftercare plans.  Reason for Continuation of Hospitalization: Depression Suicidal ideation  Estimated Length of Stay: 2-3 days   Attendees: Patient: Ralph Anderson  08/28/2016 12:25 PM  Physician: Dr. Kristine Linea, MD  08/28/2016 12:25 PM  Nursing: Hulan Amato, RN  08/28/2016 12:25 PM  RN Care Manager: 08/28/2016 12:25 PM  Social Worker: Hampton Abbot, MSW, LCSW-A 08/28/2016 12:25 PM  Recreational Therapist: Princella Ion, LRT, CTRS  08/28/2016 12:25 PM  Other:  08/28/2016 12:25 PM  Other:  08/28/2016 12:25 PM  Other: 08/28/2016 12:25 PM    Scribe for Treatment Team: Lynden Oxford, LCSWA 08/28/2016 12:25 PM

## 2016-08-28 NOTE — Progress Notes (Signed)
Recreation Therapy Notes  Date: 04.16.18 Time: 1:15 pm Location: Craft Room  Group Topic: Wellness  Goal Area(s) Addresses:  Patient will identify at least one item per dimension of health. Patient will examine areas they are deficient in.  Behavioral Response: Did not attend  Intervention: 6 Dimensions of Health  Activity: Patients were given a definition sheet with each dimension of health define and a worksheet listing each dimension of health. Patients were instructed to write at least one item they were currently doing in each dimension.  Education: LRT educated patients on other forms of self-expression.   Education Outcome: Patient did not attend group.  Clinical Observations/Feedback: Patient did not attend group.  Korbin Notaro M, LRT/CTRS 08/28/2016 2:01 PM 

## 2016-08-28 NOTE — [facility] Suicide Risk Assessment (Signed)
[facility] INPATIENT:  Family/Significant Other Suicide Prevention Education  Suicide Prevention Education:  Patient Refusal for Family/Significant Other Suicide Prevention Education: The patient Ralph Anderson has refused to provide written consent for family/significant other to be provided Family/Significant Other Suicide Prevention Education during admission and/or prior to discharge.  Physician notified.  Ida Rogue 08/28/2016, 4:31 PM

## 2016-08-28 NOTE — Plan of Care (Signed)
Problem: Coping: Goal: Ability to identify and develop effective coping behavior will improve Outcome: Progressing Working on coping skills .    

## 2016-08-28 NOTE — [facility] Group Notes (Signed)
[facility] LCSW Group Therapy   08/28/2016 9:30 am Type of Therapy: Group Therapy   Participation Level: Active   Participation Quality: Attentive, Sharing and Supportive   Affect: Appropriate  Cognitive: Alert and Oriented   Insight: Developing/Improving and Engaged   Engagement in Therapy: Developing/Improving and Engaged   Modes of Intervention: Clarification, Confrontation, Discussion, Education, Exploration,  Limit-setting, Orientation, Problem-solving, Rapport Building, Dance movement psychotherapist, Socialization and Support   Summary of Progress/Problems: Pt identified obstacles faced currently and processed barriers involved in overcoming these obstacles. Pt identified steps necessary for overcoming these obstacles and explored motivation (internal and external) for facing these difficulties head on. Pt further identified one area of concern in their lives and chose a goal to focus on for today.   Hampton Abbot, MSW, LCSW-A 08/28/2016, 10:22AM

## 2016-08-28 NOTE — B[facility] Counselor (Signed)
Adult Comprehensive Assessment  Patient ID: Ralph Anderson, male   DOB: Jul 26, 1970, 46 y.o.   MRN: [id number]   Information Source:: Patient  Current Stressors:  Educational / Learning stressors: NA Employment / Job issues: pt unemployed. Unemployed Family Relationships: None Financial / Lack of resources (include bankruptcy): no income currently Housing / Lack of housing: homeless  Substance abuse: current issues: 2 pending DWI charges   Living/Environment/Situation:  Living Arrangements: Other Homeless  "I stay here and there. I don't stay at shelters." Living conditions (as described by patient or guardian): OK How long has patient lived in current situation?: since January when he got out of [facility] What is atmosphere in current home: OtherTemporary  Family History:  Marital status: Single Are you sexually active?: Yes What is your sexual orientation?: heterosexual Has your sexual activity been affected by drugs, alcohol, medication, or emotional stress?: no Does patient have children?: No  Childhood History:  By whom was/is the patient raised?: Both parents, Sibling Additional childhood history information: pt reports good upbringing in [place]. Father owned used car lot. Strong family. One sister, still in [place]. Description of patient's relationship with caregiver when they were a child: positive relationship Patient's description of current relationship with people who raised him/her: both parents now deceased How were you disciplined when you got in trouble as a child/adolescent?: timeout, sometimes physical discipline. Pt reports it was appropriate discipline and "kept me out of trouble." Does patient have siblings?: Yes Number of Siblings: 1 Description of patient's current relationship with siblings: positive relationship. Sister is aware of his situation. Did patient suffer any verbal/emotional/physical/sexual abuse as a child?: No Did patient  suffer from severe childhood neglect?: No Has patient ever been sexually abused/assaulted/raped as an adolescent or adult?: No Was the patient ever a victim of a crime or a disaster?: No Witnessed domestic violence?: No Has patient been effected by domestic violence as an adult?: No  Education:  Highest grade of school patient has completed: 12. Financial planner. 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 [id number]. 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  If attempted suicide, did drugs/alcohol play a role in this?: No Alcohol/Substance Abuse Treatment Hx: Denies past history If yes, describe treatment: [facility] in January for 20 days "I guess I should have stayed for the whole time." Has alcohol/substance abuse ever caused legal problems?: Yes (2 pending DWI charges)  Social Support System: Patient's Community Support System: Poor Describe Community Support System:  pt has adult sibling in [place]. Type of faith/religion: Baptist. 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, construction In what areas does patient struggle / problems for patient: drinking, unemployment, homelessness  Discharge Plan:  Does patient have access to transportation?: No Will patient be returning to same living situation after discharge?: Yes Currently receiving community mental health services:Yes, Family Services If no, would patient like referral for services when discharged?:  Does patient have financial barriers related to discharge medications?: Yes Patient description of barriers related to discharge medications: no insurance    Summary/Recommendations:   Summary and Recommendations (to be completed by the evaluator): Darylene Price is a 46 YO Caucasian male diagnosed with MDD, recurrent, severe without psychosis and Alcohol Dependence.  He cites psychosocial stressors of homelessness, no income, upcoming court dates related to DUI's and a conflictual relationship with his ex girlfirend. Jemell rejected a referral to [facility] or [facility]. He plans to return to the streets of [place] at d/c and follow up at [facility], where he states he is open for servies.  In the meantime, he can benefit from crises stabilization, medicaiton management, therapeutic milieu and referral for servies.  Ida Rogue. 08/28/2016

## 2016-08-28 NOTE — Progress Notes (Signed)
[facility] MD Progress Note  08/28/2016 12:06 PM Ralph Anderson  MRN:  [id number]  Subjective:  Ralph Anderson still feels depressed and vaguely suicidal. Complains of pain and symptoms of alcohol withdrawal. He was on CIWA but will be switched to Librium taper. Vital signs are stable.   Per nursing: D: Pt denies SI/HI/AVH. Pt is pleasant and cooperative, affect is blunted he appears less anxious, patient is on CIWA q 6 hours, no withdrawal sign or symptoms noted, and he is interacting with peers and staff appropriately.  A: Pt was offered support and encouragement. Pt was given scheduled medications. Pt was encouraged to attend groups. Q 15 minute checks were done for safety.  R:Pt attends groups and interacts well with peers and staff. Pt is complaint with medication. Pt has no complaints.Pt receptive to treatment and safety maintained on unit.  Principal Problem: Severe episode of recurrent major depressive disorder, without psychotic features (HCC) Diagnosis:   Patient Active Problem List   Diagnosis Date Noted  . Tobacco use disorder [F17.200] 08/28/2016  . Adjustment disorder with mixed anxiety and depressed mood [F43.23] 08/27/2016  . HTN (hypertension) [I10] 08/27/2016  . Major depression, recurrent (HCC) [F33.9] 06/03/2016  . Chronic pain of lower extremity, bilateral [M79.604, M79.605, G89.29]   . Suicidal ideations [R45.851]   . Alcohol dependence with alcohol-induced mood disorder (HCC) [F10.24] 04/30/2016  . Severe episode of recurrent major depressive disorder, without psychotic features (HCC) [F33.2] 04/25/2016   Total Time spent with patient: 20 minutes  Past Psychiatric History: depression, alcoholism.  Past Medical History:  Past Medical History:  Diagnosis Date  . Anxiety   . Anxiety   . Depression   . Depression   . Hypertension    History reviewed. No pertinent surgical history. Family History: History reviewed. No pertinent family history. Family Psychiatric  History:  see H&P. Social History:  History  Alcohol Use  . 3.6 - 7.2 oz/week  . 6 - 12 Cans of beer per week    Comment: He reports weekly use     History  Drug Use No    Comment: Pt stated "it's been 10 yrs since I smoked marijuana"    Social History   Social History  . Marital status: Single    Spouse name: N/A  . Number of children: N/A  . Years of education: N/A   Social History Main Topics  . Smoking status: Current Every Day Smoker    Packs/day: 1.00    Types: Cigarettes  . Smokeless tobacco: Never Used  . Alcohol use 3.6 - 7.2 oz/week    6 - 12 Cans of beer per week     Comment: He reports weekly use  . Drug use: No     Comment: Pt stated "it's been 10 yrs since I smoked marijuana"  . Sexual activity: Yes    Birth control/ protection: None   Other Topics Concern  . None   Social History Narrative  . None   Additional Social History:    Pain Medications:  (none) Prescriptions:  (pt off meds for 3 months) Over the Counter:  (no) History of alcohol / drug use?: Yes Negative Consequences of Use: Financial, Legal, Personal relationships Name of Substance 1: ETOH 1 - Age of First Use:  (teens)                  Sleep: Fair  Appetite:  Fair  Current Medications: Current Facility-Administered Medications  Medication Dose Route Frequency Provider Last Rate Last  Dose  . acetaminophen (TYLENOL) tablet 650 mg  650 mg Oral Q6H PRN Darliss Ridgel, MD      . alum & mag hydroxide-simeth (MAALOX/MYLANTA) 200-200-20 MG/5ML suspension 30 mL  30 mL Oral Q4H PRN Darliss Ridgel, MD      . amLODipine (NORVASC) tablet 5 mg  5 mg Oral Daily Darliss Ridgel, MD   5 mg at 08/28/16 0748  . chlordiazePOXIDE (LIBRIUM) capsule 10 mg  10 mg Oral QID Taima Rada B Fernie Grimm, MD      . cyclobenzaprine (FLEXERIL) tablet 10 mg  10 mg Oral BID PRN Darliss Ridgel, MD   10 mg at 08/28/16 1018  . ibuprofen (ADVIL,MOTRIN) tablet 600 mg  600 mg Oral Q6H PRN Darliss Ridgel, MD   600 mg at 08/28/16 0750   . magnesium hydroxide (MILK OF MAGNESIA) suspension 30 mL  30 mL Oral Daily PRN Darliss Ridgel, MD      . nicotine (NICODERM CQ - dosed in mg/24 hours) patch 14 mg  14 mg Transdermal Daily Darliss Ridgel, MD   14 mg at 08/28/16 0753  . sertraline (ZOLOFT) tablet 50 mg  50 mg Oral Daily Darliss Ridgel, MD   50 mg at 08/28/16 0748  . thiamine (VITAMIN B-1) tablet 100 mg  100 mg Oral Daily Darliss Ridgel, MD   100 mg at 08/28/16 2130   Or  . thiamine (B-1) injection 100 mg  100 mg Intravenous Daily Darliss Ridgel, MD      . traZODone (DESYREL) tablet 100 mg  100 mg Oral QHS PRN Darliss Ridgel, MD   100 mg at 08/27/16 2144    Lab Results:  Results for orders placed or performed during the hospital encounter of 08/27/16 (from the past 48 hour(s))  TSH     Status: None   Collection Time: 08/28/16  6:49 AM  Result Value Ref Range   TSH 3.061 0.350 - 4.500 uIU/mL    Comment: Performed by a 3rd Generation assay with a functional sensitivity of <=0.01 uIU/mL.    Blood Alcohol level:  Lab Results  Component Value Date   ETH 53 (H) 08/25/2016   ETH 99 (H) 06/02/2016    Metabolic Disorder Labs: No results found for: HGBA1C, MPG No results found for: PROLACTIN No results found for: CHOL, TRIG, HDL, CHOLHDL, VLDL, LDLCALC  Physical Findings: AIMS: Facial and Oral Movements Muscles of Facial Expression: None, normal Lips and Perioral Area: None, normal Jaw: None, normal Tongue: None, normal,Extremity Movements Upper (arms, wrists, hands, fingers): None, normal Lower (legs, knees, ankles, toes): None, normal, Trunk Movements Neck, shoulders, hips: None, normal, Overall Severity Severity of abnormal movements (highest score from questions above): None, normal Incapacitation due to abnormal movements: None, normal Patient's awareness of abnormal movements (rate only patient's report): No Awareness, Dental Status Current problems with teeth and/or dentures?: No Does patient usually wear dentures?:  No  CIWA:  CIWA-Ar Total: 0 COWS:  COWS Total Score: 1  Musculoskeletal: Strength & Muscle Tone: within normal limits Gait & Station: normal Patient leans: N/A  Psychiatric Specialty Exam: Physical Exam  Nursing note and vitals reviewed. Psychiatric: His speech is normal and behavior is normal. His mood appears anxious. Cognition and memory are normal. He expresses impulsivity. He exhibits a depressed mood. He expresses suicidal ideation. He expresses suicidal plans.    Review of Systems  Psychiatric/Behavioral: Positive for depression, substance abuse and suicidal ideas.  All other systems reviewed and are  negative.   Blood pressure 130/70, pulse 91, temperature 98.3 F (36.8 C), temperature source Oral, resp. rate 18, height  (1.676 m), weight 75.1 kg (165 lb 8 oz), SpO2 97 %.Body mass index is 26.71 kg/m.  General Appearance: Fairly Groomed  Eye Contact:  Good  Speech:  Clear and Coherent  Volume:  Normal  Mood:  Anxious and Dysphoric  Affect:  Congruent  Thought Process:  Goal Directed and Descriptions of Associations: Intact  Orientation:  Full (Time, Place, and Person)  Thought Content:  WDL  Suicidal Thoughts:  Yes.  without intent/plan  Homicidal Thoughts:  No  Memory:  Immediate;   Fair Recent;   Fair Remote;   Fair  Judgement:  Impaired  Insight:  Lacking  Psychomotor Activity:  Normal  Concentration:  Concentration: Fair and Attention Span: Fair  Recall:  [employer] of Knowledge:  Fair  Language:  Fair  Akathisia:  No  Handed:  Right  AIMS (if indicated):     Assets:  Communication Skills Desire for Improvement Physical Health Resilience Social Support  ADL's:  Intact  Cognition:  WNL  Sleep:  Number of Hours: 7.15     Treatment Plan Summary: Daily contact with patient to assess and evaluate symptoms and progress in treatment and Medication management   Mr. Henner Is a 46 year old male with a history of depression and alcoholism admitted for  suicidal ideation in the context of drinking and relationship problems.  1. Suicidal ideation. The patient is able to contract for safety in the hospital.  2. Mood. He was started on Zoloft for depression.  3. Alcohol abuse. He was started on CIWA protocol. We will transition to Librium taper. Vital signs are stable.  4. Substance abuse treatment. The patient minimizes his problems and declines residential treatment. He is not interested in pharmacotherapy for alcoholism.  5. Smoking. Nicotine patch is available.  6. Hypertension. He is on Norvasc.  7. Chronic pain. He is on Flexeril and ibuprofen.  8. Insomnia. Trazodone is available.  9. Disposition. He will be discharged to the homeless shelter. He will follow up with [facility] in [place].  Kristine Linea, MD 08/28/2016, 12:06 PM

## 2016-08-28 NOTE — B[facility] Group Notes (Signed)
B[facility] LCSW Group Therapy  08/28/2016 1:15 pm  Type of Therapy: Process Group Therapy  Participation Level:  Active  Participation Quality:  Appropriate  Affect:  Flat  Cognitive:  Oriented  Insight:  Improving  Engagement in Group:  Limited  Engagement in Therapy:  Limited  Modes of Intervention:  Activity, Clarification, Education, Problem-solving and Support  Summary of Progress/Problems: Today's group addressed the issue of overcoming obstacles.  Patients were asked to identify their biggest obstacle post d/c that stands in the way of their on-going success, and then problem solve as to how to manage this. Stayed the entire time, engaged throughout. Cited biggest obstacle as ex girlfirend who will not let him alone. "She hunts me down just to harrass me-try to get my to get back together with her."  Admitted to no other obstacles-did not mention drinking nor homelessness.  Ida Rogue 08/28/2016   3:42 PM

## 2016-08-28 NOTE — Progress Notes (Signed)
D: Pt denies SI/HI/AVH. Pt is pleasant and cooperative, affect is blunted he appears less anxious, patient is on CIWA q 6 hours, no withdrawal sign or symptoms noted, and he is interacting with peers and staff appropriately.  A: Pt was offered support and encouragement. Pt was given scheduled medications. Pt was encouraged to attend groups. Q 15 minute checks were done for safety.  R:Pt attends groups and interacts well with peers and staff. Pt is complaint with medication. Pt has no complaints.Pt receptive to treatment and safety maintained on unit.

## 2016-08-28 NOTE — Progress Notes (Signed)
D: Patient stated slept good last night .Stated appetite is good and energy level  Is normal. Stated concentration is good . Stated on Depression scale 7 , hopeless 7 and anxiety 8 .( low 0-10 high) Denies suicidal  homicidal ideations  .  No auditory hallucinations  No pain concerns . Appropriate ADL'S. Interacting with peers and staff. No withdrawal symptoms  observed  A: Encourage patient participation with unit programming . Instruction  Given on  Medication , verbalize understanding. R: Voice no other concerns. Staff continue to monitor

## 2016-08-29 MED ORDER — ARIPIPRAZOLE 5 MG PO TABS
5.0000 mg | ORAL_TABLET | Freq: Every day | ORAL | Status: DC
  Administered 2016-08-29 – 2016-09-01 (×4): 5 mg via ORAL
  Filled 2016-08-29 (×4): qty 1

## 2016-08-29 MED ORDER — CHLORDIAZEPOXIDE HCL 25 MG PO CAPS
25.0000 mg | ORAL_CAPSULE | Freq: Four times a day (QID) | ORAL | Status: AC
  Administered 2016-08-29 – 2016-08-31 (×8): 25 mg via ORAL
  Filled 2016-08-29 (×8): qty 1

## 2016-08-29 NOTE — B[facility] Group Notes (Signed)
[facility] Group Notes:  (Nursing/MHT/Case Management/Adjunct)  Date:  08/29/2016  Time:  10:47 PM  Type of Therapy:  Psychoeducational Skills  Participation Level:  Active  Participation Quality:  Appropriate and Attentive  Affect:  Appropriate  Cognitive:  Oriented  Insight:  Good  Engagement in Group:  Engaged  Modes of Intervention:  Discussion and Exploration  Summary of Progress/Problems:  Foy Gu[place]ajara 08/29/2016, 10:47 PM

## 2016-08-29 NOTE — Progress Notes (Signed)
Patient ID: Ralph Anderson, male   DOB: 06-13-1970, 46 y.o.   MRN: [id number] A&Ox3, VSS, poorly presented appearance in layered clothes with the right hand splint, polite, quiet, no behavioral problems, requested for Trazodone 100 mg for sleep, no ETOH withdrawal symptoms.

## 2016-08-29 NOTE — Plan of Care (Signed)
Problem: Activity: Goal: Sleeping patterns will improve Outcome: Progressing Patient slept for Estimated Hours of 7; every 15 minutes safety round maintained, no injury or falls during this shift.    

## 2016-08-29 NOTE — B[facility] Group Notes (Signed)
[facility] Group Notes:  (Nursing/MHT/Case Management/Adjunct)  Date:  08/29/2016  Time:  6:03 PM  Type of Therapy:  Psychoeducational Skills  Participation Level:  Active  Participation Quality:  Appropriate, Attentive and Supportive  Affect:  Flat  Cognitive:  Appropriate  Insight:  Improving  Engagement in Group:  Engaged and Supportive  Modes of Intervention:  Discussion and Education  Summary of Progress/Problems:  Ralph Anderson 08/29/2016, 6:03 PM

## 2016-08-29 NOTE — Progress Notes (Signed)
Recreation Therapy Notes  INPATIENT RECREATION THERAPY ASSESSMENT  Patient Details Name: Yanuel Tagg MRN: [id number] DOB: Sep 27, 1970 Today's Date: 08/29/2016  Patient Stressors: Relationship, Work, Other (Comment) (Broke-up with girlfriend of 4 years - she is between him and another guy; hurt hand and is trying to work; homeless)  Coping Skills:   Isolate, Arguments, Substance Abuse, Avoidance, Exercise, Art/Dance, Talking, Music, Sports  Personal Challenges: Anger, Expressing Yourself, Self-Esteem/Confidence, Stress Management, Substance Abuse  Leisure Interests (2+):  Exercise - Walking, Music - Listen  Awareness of Community Resources:  Yes  Community Resources:  Park  Current Use: Yes  If no, Barriers?:    Patient Strengths:  Loves God, good person  Patient Identified Areas of Improvement:  Drinking and living situation  Current Recreation Participation:  Hanging out with people  Patient Goal for Hospitalization:  To get out, get a job, get a place to live and live right  [place] of Residence:  [place] of Residence:  [place]   Current SI (including self-harm):  Yes  Current HI:  Yes ("sometimes")  Consent to Intern Participation: N/A   Jacquelynn Cree, LRT/CTRS 08/29/2016, 4:30 PM

## 2016-08-29 NOTE — [facility] Group Notes (Signed)
[facility] Group Notes:  (Nursing/MHT/Case Management/Adjunct)  Date:  08/29/2016  Time:  4:20 AM  Type of Therapy:  Group Therapy  Participation Level:  Active  Participation Quality:  Appropriate  Affect:  Appropriate  Cognitive:  Appropriate  Insight:  Appropriate  Engagement in Group:  Engaged  Modes of Intervention:  n/a  Summary of Progress/Problems:  Ralph Anderson 08/29/2016, 4:20 AM 

## 2016-08-29 NOTE — [facility] Group Notes (Signed)
[facility] LCSW Group Therapy Note  Date/Time: 08/29/16, 0930  Type of Therapy/Topic:  Group Therapy:  Feelings about Diagnosis  Participation Level:  Active   Mood:pleasant   Description of Group:    This group will allow patients to explore their thoughts and feelings about diagnoses they have received. Patients will be guided to explore their level of understanding and acceptance of these diagnoses. Facilitator will encourage patients to process their thoughts and feelings about the reactions of others to their diagnosis, and will guide patients in identifying ways to discuss their diagnosis with significant others in their lives. This group will be process-oriented, with patients participating in exploration of their own experiences as well as giving and receiving support and challenge from other group members.   Therapeutic Goals: 1. Patient will demonstrate understanding of diagnosis as evidence by identifying two or more symptoms of the disorder:  2. Patient will be able to express two feelings regarding the diagnosis 3. Patient will demonstrate ability to communicate their needs through discussion and/or role plays  Summary of Patient Progress: Pt identified symptoms of both depression and alcohol use disorder: hopelessness, anxiety, suicidal thoughts, efforts to cut back on his drinking.  Pt participated in discussion about stigma attached to mental health disorders.  Pt was attentive throughout group.        Therapeutic Modalities:   Cognitive Behavioral Therapy Brief Therapy Feelings Identification   Daleen Squibb, LCSW

## 2016-08-29 NOTE — Progress Notes (Signed)
Recreation Therapy Notes  Date: 04.17.18 Time: 3"00 pm Location: Craft Room  Group Topic: Communication  Goal Area(s) Addresses:  Patient will use effective communication skills. Patient will verbalize importance of using communication skills post d/c.  Behavioral Response: Attentive, Interactive  Intervention: Blind Drawing  Activity: Patients were put in pairs back-to-back. One patient was given a picture and was instructed to give the other patient directions on how to draw the picture without saying what the picture was. After both patients had a change at giving directions, patients were able to do a second round when they were side-by-side instead of back-to-back.  Education: LRT educated patients on Manufacturing systems engineer.  Education Outcome: Acknowledges education/In group clarification offered  Clinical Observations/Feedback: Patient gave instructions to peer and received instructions. Patient contributed to group discussion by stating what was difficult about the activity, and why communication is important.  Jacquelynn Cree, LRT/CTRS 08/29/2016 3:43 PM

## 2016-08-29 NOTE — Progress Notes (Signed)
D: Patient stated slept good last night .Stated appetite is good and energy level lowl. Stated concentration is good . Stated on Depression scale 6 , hopeless  5`and anxiety 5.( low 0-10 high) Denies suicidal  homicidal ideations  .  No auditory hallucinations  No pain concerns . Appropriate ADL'S. Interacting with peers and staff. Voice of a  Court date on Friday . Increased symptoms of withdrawal , sweating [place] on coping skills  A: Encourage patient participation with unit programming . Instruction  Given on  Medication , verbalize understanding. R: Voice no other concerns. Staff continue to monitor

## 2016-08-29 NOTE — Progress Notes (Signed)
[facility] MD Progress Note  08/29/2016 11:11 AM Ralph Anderson  MRN:  [id number]  Subjective:   08/28/2016. Ralph Anderson still feels depressed and vaguely suicidal. Complains of pain and symptoms of alcohol withdrawal. He was on CIWA but will be switched to Librium taper. Vital signs are stable.   08/29/2016. Ralph Anderson woke up today feeling suicidal. He complains of symptoms of withdrawal and has been sweating heavily during our conversation. He is on Librium taper. His vital signs are stable but we may need to increase the dose of Librium. His mood is depressed with anxious affect. His grooming is questionable. We tried to engage him in a conversation about discharge planning but the patient seems confused or undecided. He refuses residential substance abuse treatment program participation or [facility] and believes that he can get a job and save up some money while being homeless. He accepts medication and tolerates them well. He complains of hand pain from a pinched nerve. He wears a brace.  Per nursing: A&Ox3, VSS, poorly presented appearance in layered clothes with the right hand splint, polite, quiet, no behavioral problems, requested for Trazodone 100 mg for sleep, no ETOH withdrawal symptoms.  Principal Problem: Severe episode of recurrent major depressive disorder, without psychotic features (HCC) Diagnosis:   Patient Active Problem List   Diagnosis Date Noted  . Tobacco use disorder [F17.200] 08/28/2016  . Adjustment disorder with mixed anxiety and depressed mood [F43.23] 08/27/2016  . HTN (hypertension) [I10] 08/27/2016  . Major depression, recurrent (HCC) [F33.9] 06/03/2016  . Chronic pain of lower extremity, bilateral [M79.604, M79.605, G89.29]   . Suicidal ideations [R45.851]   . Alcohol dependence with alcohol-induced mood disorder (HCC) [F10.24] 04/30/2016  . Severe episode of recurrent major depressive disorder, without psychotic features (HCC) [F33.2] 04/25/2016   Total Time spent with  patient: 20 minutes  Past Psychiatric History: depression, alcoholism.  Past Medical History:  Past Medical History:  Diagnosis Date  . Anxiety   . Anxiety   . Depression   . Depression   . Hypertension    History reviewed. No pertinent surgical history. Family History: History reviewed. No pertinent family history. Family Psychiatric  History: see H&P. Social History:  History  Alcohol Use  . 3.6 - 7.2 oz/week  . 6 - 12 Cans of beer per week    Comment: He reports weekly use     History  Drug Use No    Comment: Pt stated "it's been 10 yrs since I smoked marijuana"    Social History   Social History  . Marital status: Single    Spouse name: N/A  . Number of children: N/A  . Years of education: N/A   Social History Main Topics  . Smoking status: Current Every Day Smoker    Packs/day: 1.00    Types: Cigarettes  . Smokeless tobacco: Never Used  . Alcohol use 3.6 - 7.2 oz/week    6 - 12 Cans of beer per week     Comment: He reports weekly use  . Drug use: No     Comment: Pt stated "it's been 10 yrs since I smoked marijuana"  . Sexual activity: Yes    Birth control/ protection: None   Other Topics Concern  . None   Social History Narrative  . None   Additional Social History:  Specify valuables returned: pant 2 socks  boot  hat  belt  cigrette 27 cent 1 dime 1 nickle 12 pennies  chips  tote bag blue  pen  Pain Medications:  (none) Prescriptions:  (pt off meds for 3 months) Over the Counter:  (no) History of alcohol / drug use?: Yes Negative Consequences of Use: Financial, Legal, Personal relationships Name of Substance 1: ETOH 1 - Age of First Use:  (teens)                  Sleep: Fair  Appetite:  Fair  Current Medications: Current Facility-Administered Medications  Medication Dose Route Frequency Provider Last Rate Last Dose  . acetaminophen (TYLENOL) tablet 650 mg  650 mg Oral Q6H PRN Darliss Ridgel, MD      . alum & mag hydroxide-simeth  (MAALOX/MYLANTA) 200-200-20 MG/5ML suspension 30 mL  30 mL Oral Q4H PRN Darliss Ridgel, MD      . amLODipine (NORVASC) tablet 5 mg  5 mg Oral Daily Darliss Ridgel, MD   5 mg at 08/29/16 0845  . chlordiazePOXIDE (LIBRIUM) capsule 10 mg  10 mg Oral QID Shari Prows, MD   10 mg at 08/29/16 0845  . cyclobenzaprine (FLEXERIL) tablet 10 mg  10 mg Oral BID PRN Darliss Ridgel, MD   10 mg at 08/28/16 1018  . ibuprofen (ADVIL,MOTRIN) tablet 600 mg  600 mg Oral Q6H PRN Darliss Ridgel, MD   600 mg at 08/28/16 0750  . magnesium hydroxide (MILK OF MAGNESIA) suspension 30 mL  30 mL Oral Daily PRN Darliss Ridgel, MD      . nicotine (NICODERM CQ - dosed in mg/24 hours) patch 14 mg  14 mg Transdermal Daily Darliss Ridgel, MD   14 mg at 08/29/16 0847  . sertraline (ZOLOFT) tablet 50 mg  50 mg Oral Daily Darliss Ridgel, MD   50 mg at 08/29/16 0845  . thiamine (VITAMIN B-1) tablet 100 mg  100 mg Oral Daily Darliss Ridgel, MD   100 mg at 08/29/16 0845   Or  . thiamine (B-1) injection 100 mg  100 mg Intravenous Daily Darliss Ridgel, MD      . traZODone (DESYREL) tablet 100 mg  100 mg Oral QHS PRN Darliss Ridgel, MD   100 mg at 08/28/16 2204    Lab Results:  Results for orders placed or performed during the hospital encounter of 08/27/16 (from the past 48 hour(s))  TSH     Status: None   Collection Time: 08/28/16  6:49 AM  Result Value Ref Range   TSH 3.061 0.350 - 4.500 uIU/mL    Comment: Performed by a 3rd Generation assay with a functional sensitivity of <=0.01 uIU/mL.    Blood Alcohol level:  Lab Results  Component Value Date   ETH 53 (H) 08/25/2016   ETH 99 (H) 06/02/2016    Metabolic Disorder Labs: No results found for: HGBA1C, MPG No results found for: PROLACTIN No results found for: CHOL, TRIG, HDL, CHOLHDL, VLDL, LDLCALC  Physical Findings: AIMS: Facial and Oral Movements Muscles of Facial Expression: None, normal Lips and Perioral Area: None, normal Jaw: None, normal Tongue: None,  normal,Extremity Movements Upper (arms, wrists, hands, fingers): None, normal Lower (legs, knees, ankles, toes): None, normal, Trunk Movements Neck, shoulders, hips: None, normal, Overall Severity Severity of abnormal movements (highest score from questions above): None, normal Incapacitation due to abnormal movements: None, normal Patient's awareness of abnormal movements (rate only patient's report): No Awareness, Dental Status Current problems with teeth and/or dentures?: No Does patient usually wear dentures?: No  CIWA:  CIWA-Ar Total: 0 COWS:  COWS  Total Score: 1  Musculoskeletal: Strength & Muscle Tone: within normal limits Gait & Station: normal Patient leans: N/A  Psychiatric Specialty Exam: Physical Exam  Nursing note and vitals reviewed. Psychiatric: His speech is normal and behavior is normal. His mood appears anxious. Cognition and memory are normal. He expresses impulsivity. He exhibits a depressed mood. He expresses suicidal ideation. He expresses suicidal plans.    Review of Systems  Psychiatric/Behavioral: Positive for depression, substance abuse and suicidal ideas.  All other systems reviewed and are negative.   Blood pressure 125/76, pulse 79, temperature 98.2 F (36.8 C), temperature source Oral, resp. rate 18, height  (1.676 m), weight 75.1 kg (165 lb 8 oz), SpO2 100 %.Body mass index is 26.71 kg/m.  General Appearance: Fairly Groomed  Eye Contact:  Good  Speech:  Clear and Coherent  Volume:  Normal  Mood:  Anxious and Dysphoric  Affect:  Congruent  Thought Process:  Goal Directed and Descriptions of Associations: Intact  Orientation:  Full (Time, Place, and Person)  Thought Content:  WDL  Suicidal Thoughts:  Yes.  without intent/plan  Homicidal Thoughts:  No  Memory:  Immediate;   Fair Recent;   Fair Remote;   Fair  Judgement:  Impaired  Insight:  Lacking  Psychomotor Activity:  Normal  Concentration:  Concentration: Fair and Attention Span:  Fair  Recall:  [employer] of Knowledge:  Fair  Language:  Fair  Akathisia:  No  Handed:  Right  AIMS (if indicated):     Assets:  Communication Skills Desire for Improvement Physical Health Resilience Social Support  ADL's:  Intact  Cognition:  WNL  Sleep:  Number of Hours: 7     Treatment Plan Summary: Daily contact with patient to assess and evaluate symptoms and progress in treatment and Medication management   Mr. Taaffe Is a 46 year old male with a history of depression and alcoholism admitted for suicidal ideation in the context of drinking and relationship problems.  1. Suicidal ideation. The patient is able to contract for safety in the hospital.  2. Mood. He was started on Zoloft for depression. I will add Abilify for augmentation.    3. Alcohol abuse. He was started on CIWA protocol and transitioned to Librium taper. I will increase Librium dose to 25 mg qid. Vital signs are stable.  4. Substance abuse treatment. The patient minimizes his problems and declines residential treatment. He is not interested in pharmacotherapy for alcoholism.  5. Smoking. Nicotine patch is available.  6. Hypertension. He is on Norvasc.  7. Chronic pain. He is on Flexeril and ibuprofen.  8. Insomnia. Trazodone is available.  9. Disposition. He will be discharged to the homeless shelter. He will follow up with [facility] in [place].  Kristine Linea, MD 08/29/2016, 11:11 AM

## 2016-08-29 NOTE — Plan of Care (Signed)
Problem: Coping: Goal: Ability to identify and develop effective coping behavior will improve Outcome: Progressing Working on coping skills .    

## 2016-08-30 NOTE — Tx Team (Addendum)
Interdisciplinary Treatment and Diagnostic Plan Update  08/30/2016 Time of Session: 10:30 AM Ralph Anderson MRN: [id number]  Principal Diagnosis: Severe episode of recurrent major depressive disorder, without psychotic features (HCC)  Secondary Diagnoses: Principal Problem:   Severe episode of recurrent major depressive disorder, without psychotic features (HCC) Active Problems:   Alcohol dependence with alcohol-induced mood disorder (HCC)   Suicidal ideations   Adjustment disorder with mixed anxiety and depressed mood   HTN (hypertension)   Tobacco use disorder   Current Medications:  Current Facility-Administered Medications  Medication Dose Route Frequency Provider Last Rate Last Dose  . acetaminophen (TYLENOL) tablet 650 mg  650 mg Oral Q6H PRN Darliss Ridgel, MD      . alum & mag hydroxide-simeth (MAALOX/MYLANTA) 200-200-20 MG/5ML suspension 30 mL  30 mL Oral Q4H PRN Darliss Ridgel, MD      . amLODipine (NORVASC) tablet 5 mg  5 mg Oral Daily Darliss Ridgel, MD   5 mg at 08/30/16 0830  . ARIPiprazole (ABILIFY) tablet 5 mg  5 mg Oral Daily Shari Prows, MD   5 mg at 08/30/16 0829  . chlordiazePOXIDE (LIBRIUM) capsule 25 mg  25 mg Oral QID Shari Prows, MD   25 mg at 08/30/16 0830  . cyclobenzaprine (FLEXERIL) tablet 10 mg  10 mg Oral BID PRN Darliss Ridgel, MD   10 mg at 08/28/16 1018  . ibuprofen (ADVIL,MOTRIN) tablet 600 mg  600 mg Oral Q6H PRN Darliss Ridgel, MD   600 mg at 08/28/16 0750  . magnesium hydroxide (MILK OF MAGNESIA) suspension 30 mL  30 mL Oral Daily PRN Darliss Ridgel, MD      . nicotine (NICODERM CQ - dosed in mg/24 hours) patch 14 mg  14 mg Transdermal Daily Darliss Ridgel, MD   14 mg at 08/30/16 4098  . sertraline (ZOLOFT) tablet 50 mg  50 mg Oral Daily Darliss Ridgel, MD   50 mg at 08/30/16 0829  . thiamine (VITAMIN B-1) tablet 100 mg  100 mg Oral Daily Darliss Ridgel, MD   100 mg at 08/30/16 0830   Or  . thiamine (B-1) injection 100 mg  100 mg Intravenous  Daily Darliss Ridgel, MD      . traZODone (DESYREL) tablet 100 mg  100 mg Oral QHS PRN Darliss Ridgel, MD   100 mg at 08/28/16 2204   PTA Medications: Prescriptions Prior to Admission  Medication Sig Dispense Refill Last Dose  . cyclobenzaprine (FLEXERIL) 10 MG tablet Take 1 tablet (10 mg total) by mouth 2 (two) times daily as needed for muscle spasms. (Patient not taking: Reported on 08/25/2016) 20 tablet 0 Not Taking at Unknown time  . ibuprofen (ADVIL,MOTRIN) 400 MG tablet Take 1.5 tablets (600 mg total) by mouth every 6 (six) hours as needed. (Patient not taking: Reported on 08/25/2016) 20 tablet 0 Not Taking at Unknown time    Patient Stressors: Financial difficulties Health problems Legal issue Medication change or noncompliance Other: alcohol abuse  Patient Strengths: Active sense of humor Average or above average intelligence Capable of independent living  Treatment Modalities: Medication Management, Group therapy, Case management,  1 to 1 session with clinician, Psychoeducation, Recreational therapy.   Physician Treatment Plan for Primary Diagnosis: Severe episode of recurrent major depressive disorder, without psychotic features (HCC) Long Term Goal(s): Improvement in symptoms so as ready for discharge Improvement in symptoms so as ready for discharge   Short Term Goals: Ability to verbalize  feelings will improve Ability to disclose and discuss suicidal ideas Ability to identify and develop effective coping behaviors will improve Compliance with prescribed medications will improve Ability to identify triggers associated with substance abuse/mental health issues will improve Ability to identify changes in lifestyle to reduce recurrence of condition will improve Ability to verbalize feelings will improve Ability to disclose and discuss suicidal ideas Ability to identify and develop effective coping behaviors will improve Ability to identify triggers associated with substance  abuse/mental health issues will improve  Medication Management: Evaluate patient's response, side effects, and tolerance of medication regimen.  Therapeutic Interventions: 1 to 1 sessions, Unit Group sessions and Medication administration.  Evaluation of Outcomes: Progressing  Physician Treatment Plan for Secondary Diagnosis: Principal Problem:   Severe episode of recurrent major depressive disorder, without psychotic features (HCC) Active Problems:   Alcohol dependence with alcohol-induced mood disorder (HCC)   Suicidal ideations   Adjustment disorder with mixed anxiety and depressed mood   HTN (hypertension)   Tobacco use disorder  Long Term Goal(s): Improvement in symptoms so as ready for discharge Improvement in symptoms so as ready for discharge   Short Term Goals: Ability to verbalize feelings will improve Ability to disclose and discuss suicidal ideas Ability to identify and develop effective coping behaviors will improve Compliance with prescribed medications will improve Ability to identify triggers associated with substance abuse/mental health issues will improve Ability to identify changes in lifestyle to reduce recurrence of condition will improve Ability to verbalize feelings will improve Ability to disclose and discuss suicidal ideas Ability to identify and develop effective coping behaviors will improve Ability to identify triggers associated with substance abuse/mental health issues will improve     Medication Management: Evaluate patient's response, side effects, and tolerance of medication regimen.  Therapeutic Interventions: 1 to 1 sessions, Unit Group sessions and Medication administration.  Evaluation of Outcomes: Progressing   RN Treatment Plan for Primary Diagnosis: Severe episode of recurrent major depressive disorder, without psychotic features (HCC) Long Term Goal(s): Knowledge of disease and therapeutic regimen to maintain health will improve  Short  Term Goals: Ability to remain free from injury will improve, Ability to verbalize feelings will improve and Compliance with prescribed medications will improve  Medication Management: RN will administer medications as ordered by provider, will assess and evaluate patient's response and provide education to patient for prescribed medication. RN will report any adverse and/or side effects to prescribing provider.  Therapeutic Interventions: 1 on 1 counseling sessions, Psychoeducation, Medication administration, Evaluate responses to treatment, Monitor vital signs and CBGs as ordered, Perform/monitor CIWA, COWS, AIMS and Fall Risk screenings as ordered, Perform wound care treatments as ordered.  Evaluation of Outcomes: Progressing   LCSW Treatment Plan for Primary Diagnosis: Severe episode of recurrent major depressive disorder, without psychotic features (HCC) Long Term Goal(s): Safe transition to appropriate next level of care at discharge, Engage patient in therapeutic group addressing interpersonal concerns.  Short Term Goals: Engage patient in aftercare planning with referrals and resources, Increase social support and Identify triggers associated with mental health/substance abuse issues  Therapeutic Interventions: Assess for all discharge needs, 1 to 1 time with Social worker, Explore available resources and support systems, Assess for adequacy in community support network, Educate family and significant other(s) on suicide prevention, Complete Psychosocial Assessment, Interpersonal group therapy.  Evaluation of Outcomes: Progressing    Recreational Therapy Treatment Plan for Primary Diagnosis: Severe episode of recurrent major depressive disorder, without psychotic features (HCC) Long Term Goal(s): Patient will participate in  recreation therapy treatment in at least 2 group sessions without prompting from LRT  Short Term Goals: Increase healthy coping skills, Increase stress management  skills  Treatment Modalities: Group Therapy and Individual Treatment Sessions  Therapeutic Interventions: Psychoeducation  Evaluation of Outcomes: Progressing   Progress in Treatment: Attending groups: Yes. Participating in groups: Yes. Taking medication as prescribed: Yes. Toleration medication: Yes. Family/Significant other contact made: Pt refused family contact. Patient understands diagnosis: Yes. Discussing patient identified problems/goals with staff: Yes. Medical problems stabilized or resolved: Yes. Denies suicidal/homicidal ideation: Yes. Issues/concerns per patient self-inventory: No.  New problem(s) identified: No, Describe:  None identified.  New Short Term/Long Term Goal(s): Patient goal is to find a job and housing.  Discharge Plan or Barriers: CSW assessing proper aftercare plans.  Reason for Continuation of Hospitalization: Depression Suicidal ideation  Estimated Length of Stay: 2-3 days   Attendees: Patient: Ralph Anderson  08/30/2016 11:36 AM  Physician: Dr. Kristine Linea, MD  08/30/2016 11:36 AM  Nursing: Nira Retort, RN  08/30/2016 11:36 AM  RN Care Manager: 08/30/2016 11:36 AM  Social Worker: Hampton Abbot, MSW, LCSW-A 08/30/2016 11:36 AM  Recreational Therapist: Princella Ion, LRT/CTRS  08/30/2016 11:36 AM  Other:  08/30/2016 11:36 AM  Other:  08/30/2016 11:36 AM  Other: 08/30/2016 11:36 AM    Scribe for Treatment Team: Lynden [facility], LCSWA 08/30/2016 11:36 AM

## 2016-08-30 NOTE — Plan of Care (Addendum)
Problem: Activity: Goal: Sleeping patterns will improve Outcome: Progressing Patient slept for Estimated Hours of 7.30; every 15 minutes safety round maintained, no injury or falls during this shift.    

## 2016-08-30 NOTE — Plan of Care (Signed)
Problem: Coping: Goal: Ability to identify and develop effective coping behavior will improve Outcome: Progressing Working on Materials engineer

## 2016-08-30 NOTE — Progress Notes (Signed)
[facility] MD Progress Note  08/30/2016 3:01 PM Ralph Anderson  MRN:  [id number]  Subjective:   08/28/2016. Ralph Anderson still feels depressed and vaguely suicidal. Complains of pain and symptoms of alcohol withdrawal. He was on CIWA but will be switched to Librium taper. Vital signs are stable.   08/29/2016. Ralph Anderson woke up today feeling suicidal. He complains of symptoms of withdrawal and has been sweating heavily during our conversation. He is on Librium taper. His vital signs are stable but we may need to increase the dose of Librium. His mood is depressed with anxious affect. His grooming is questionable. We tried to engage him in a conversation about discharge planning but the patient seems confused or undecided. He refuses residential substance abuse treatment program participation or [place] and believes that he can get a job and save up some money while being homeless. He accepts medication and tolerates them well. He complains of hand pain from a pinched nerve. He wears a brace.  08/30/2016. Ralph Anderson met with treatment team today. He again has vague suicidal thoughts and overall feels depressed and very anxious. Apparently in addition to joblessness and homelessness he violated a restraining order last month and possibly will go to jail. He accepts medications and tolerates them well. Vital signs are stable and he denies symptoms of alcohol withdrawal with increased dose of lithium. Sleep and appetite are fair. He participates in programming.  Per nursing: Improved appearance, removed the Jean's jacket, left the red long sleeve sportswear, playing cards with peers; "I have pinched nerve on my right hand, that's why I wear the splint. I have 6/10 pain, it stays there all the time, I got used to it, it doesn't bother me anymore. I told the Dr that I will kill myself when she told me that I am going to be discharged; I need couple of more days and I will be okay..." Improved affect and mood, demonstrated  understanding of medication adjustment, denied AV/H.  Principal Problem: Severe episode of recurrent major depressive disorder, without psychotic features ([place]) Diagnosis:   Patient Active Problem List   Diagnosis Date Noted  . Tobacco use disorder [F17.200] 08/28/2016  . Adjustment disorder with mixed anxiety and depressed mood [F43.23] 08/27/2016  . HTN (hypertension) [I10] 08/27/2016  . Major depression, recurrent ([place]) [F33.9] 06/03/2016  . Chronic pain of lower extremity, bilateral [M79.604, M79.605, G89.29]   . Suicidal ideations [R45.851]   . Alcohol dependence with alcohol-induced mood disorder ([place]) [F10.24] 04/30/2016  . Severe episode of recurrent major depressive disorder, without psychotic features ([place]) [F33.2] 04/25/2016   Total Time spent with patient: 20 minutes  Past Psychiatric History: depression, alcoholism.  Past Medical History:  Past Medical History:  Diagnosis Date  . Anxiety   . Anxiety   . Depression   . Depression   . Hypertension    History reviewed. No pertinent surgical history. Family History: History reviewed. No pertinent family history. Family Psychiatric  History: see H&P. Social History:  History  Alcohol Use  . 3.6 - 7.2 oz/week  . 6 - 12 Cans of beer per week    Comment: He reports weekly use     History  Drug Use No    Comment: Pt stated "it's been 10 yrs since I smoked marijuana"    Social History   Social History  . Marital status: Single    Spouse name: N/A  . Number of children: N/A  . Years of education: N/A   Social  History Main Topics  . Smoking status: Current Every Day Smoker    Packs/day: 1.00    Types: Cigarettes  . Smokeless tobacco: Never Used  . Alcohol use 3.6 - 7.2 oz/week    6 - 12 Cans of beer per week     Comment: He reports weekly use  . Drug use: No     Comment: Pt stated "it's been 10 yrs since I smoked marijuana"  . Sexual activity: Yes    Birth control/ protection: None   Other Topics  Concern  . None   Social History Narrative  . None   Additional Social History:  Specify valuables returned: pant 2 socks  boot  hat  belt  cigrette 27 cent 1 dime 1 nickle 12 pennies  chips  tote bag blue pen  Pain Medications:  (none) Prescriptions:  (pt off meds for 3 months) Over the Counter:  (no) History of alcohol / drug use?: Yes Negative Consequences of Use: Financial, Legal, Personal relationships Name of Substance 1: ETOH 1 - Age of First Use:  (teens)                  Sleep: Fair  Appetite:  Fair  Current Medications: Current Facility-Administered Medications  Medication Dose Route Frequency Provider Last Rate Last Dose  . acetaminophen (TYLENOL) tablet 650 mg  650 mg Oral Q6H PRN Chauncey Mann, MD      . alum & mag hydroxide-simeth (MAALOX/MYLANTA) 200-200-20 MG/5ML suspension 30 mL  30 mL Oral Q4H PRN Chauncey Mann, MD      . amLODipine (NORVASC) tablet 5 mg  5 mg Oral Daily Chauncey Mann, MD   5 mg at 08/30/16 0830  . ARIPiprazole (ABILIFY) tablet 5 mg  5 mg Oral Daily Clovis Fredrickson, MD   5 mg at 08/30/16 0829  . chlordiazePOXIDE (LIBRIUM) capsule 25 mg  25 mg Oral QID Clovis Fredrickson, MD   25 mg at 08/30/16 1248  . cyclobenzaprine (FLEXERIL) tablet 10 mg  10 mg Oral BID PRN Chauncey Mann, MD   10 mg at 08/28/16 1018  . ibuprofen (ADVIL,MOTRIN) tablet 600 mg  600 mg Oral Q6H PRN Chauncey Mann, MD   600 mg at 08/28/16 0750  . magnesium hydroxide (MILK OF MAGNESIA) suspension 30 mL  30 mL Oral Daily PRN Chauncey Mann, MD      . nicotine (NICODERM CQ - dosed in mg/24 hours) patch 14 mg  14 mg Transdermal Daily Chauncey Mann, MD   14 mg at 08/30/16 7035  . sertraline (ZOLOFT) tablet 50 mg  50 mg Oral Daily Chauncey Mann, MD   50 mg at 08/30/16 0829  . thiamine (VITAMIN B-1) tablet 100 mg  100 mg Oral Daily Chauncey Mann, MD   100 mg at 08/30/16 0830   Or  . thiamine (B-1) injection 100 mg  100 mg Intravenous Daily Chauncey Mann, MD      . traZODone  (DESYREL) tablet 100 mg  100 mg Oral QHS PRN Chauncey Mann, MD   100 mg at 08/28/16 2204    Lab Results:  No results found for this or any previous visit (from the past 60 hour(s)).  Blood Alcohol level:  Lab Results  Component Value Date   ETH 53 (H) 08/25/2016   ETH 99 (H) 00/93/8182    Metabolic Disorder Labs: No results found for: HGBA1C, MPG No results found for: PROLACTIN No results found for:  CHOL, TRIG, HDL, CHOLHDL, VLDL, LDLCALC  Physical Findings: AIMS: Facial and Oral Movements Muscles of Facial Expression: None, normal Lips and Perioral Area: None, normal Jaw: None, normal Tongue: None, normal,Extremity Movements Upper (arms, wrists, hands, fingers): None, normal Lower (legs, knees, ankles, toes): None, normal, Trunk Movements Neck, shoulders, hips: None, normal, Overall Severity Severity of abnormal movements (highest score from questions above): None, normal Incapacitation due to abnormal movements: None, normal Patient's awareness of abnormal movements (rate only patient's report): No Awareness, Dental Status Current problems with teeth and/or dentures?: No Does patient usually wear dentures?: No  CIWA:  CIWA-Ar Total: 0 COWS:  COWS Total Score: 1  Musculoskeletal: Strength & Muscle Tone: within normal limits Gait & Station: normal Patient leans: N/A  Psychiatric Specialty Exam: Physical Exam  Nursing note and vitals reviewed. Psychiatric: His speech is normal and behavior is normal. His mood appears anxious. Cognition and memory are normal. He expresses impulsivity. He exhibits a depressed mood. He expresses suicidal ideation. He expresses suicidal plans.    Review of Systems  Psychiatric/Behavioral: Positive for depression, substance abuse and suicidal ideas.  All other systems reviewed and are negative.   Blood pressure 122/67, pulse 82, temperature 98.2 F (36.8 C), temperature source Oral, resp. rate 17, height 5' 6"  (1.676 m), weight 75.1 kg  (165 lb 8 oz), SpO2 100 %.Body mass index is 26.71 kg/m.  General Appearance: Fairly Groomed  Eye Contact:  Good  Speech:  Clear and Coherent  Volume:  Normal  Mood:  Anxious and Dysphoric  Affect:  Congruent  Thought Process:  Goal Directed and Descriptions of Associations: Intact  Orientation:  Full (Time, Place, and Person)  Thought Content:  WDL  Suicidal Thoughts:  Yes.  without intent/plan  Homicidal Thoughts:  No  Memory:  Immediate;   Fair Recent;   Fair Remote;   Fair  Judgement:  Impaired  Insight:  Lacking  Psychomotor Activity:  Normal  Concentration:  Concentration: Fair and Attention Span: Fair  Recall:  [employer] of Knowledge:  Fair  Language:  Fair  Akathisia:  No  Handed:  Right  AIMS (if indicated):     Assets:  Communication Skills Desire for Improvement Physical Health Resilience Social Support  ADL's:  Intact  Cognition:  WNL  Sleep:  Number of Hours: 7.3     Treatment Plan Summary: Daily contact with patient to assess and evaluate symptoms and progress in treatment and Medication management   Ralph Anderson Is a 46 year old male with a history of depression and alcoholism admitted for suicidal ideation in the context of drinking and relationship problems.  1. Suicidal ideation. The patient is able to contract for safety in the hospital.  2. Mood. He was started on Zoloft for depression. I will add Abilify for augmentation.    3. Alcohol abuse. He was started on CIWA protocol and transitioned to Librium taper. I will increase Librium dose to 25 mg qid. Vital signs are stable.  4. Substance abuse treatment. The patient minimizes his problems and declines residential treatment. He is not interested in pharmacotherapy for alcoholism.  5. Smoking. Nicotine patch is available.  6. Hypertension. He is on Norvasc.  7. Chronic pain. He is on Flexeril and ibuprofen.  8. Insomnia. Trazodone is available.  9. Disposition. He will be discharged to the  homeless shelter. He will follow up with [facility] in [place].  Orson Slick, MD 08/30/2016, 3:01 PM
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
## 2016-08-30 NOTE — Progress Notes (Signed)
Patient ID: Ralph Anderson, male   DOB: 25-Feb-1971, 46 y.o.   MRN: [id number] Improved appearance, removed the Jean's jacket, left the red long sleeve sportswear, playing cards with peers; "I have pinched nerve on my right hand, that's why I wear the splint. I have 6/10 pain, it stays there all the time, I got used to it, it doesn't bother me anymore. I told the Dr that I will kill myself when she told me that I am going to be discharged; I need couple of more days and I will be okay..." Improved affect and mood, demonstrated understanding of medication adjustment, denied AV/H.

## 2016-08-30 NOTE — Progress Notes (Signed)
Recreation Therapy Notes  Date: 04.18.18 Time: 1:00 pm Location: Craft Room  Group Topic: Self-esteem  Goal Area(s) Addresses:  Patient will be able to identify benefit of having a healthy self-esteem. Patient will be able to identify ways to increase self-esteem.  Behavioral Response: Attentive, Interactive  Intervention: Self-Portrait  Activity: Patients were given a blank face worksheet and were instructed to draw their self-portrait of how they were feeling. Patients were given paper and were instructed to write a positive trait about themselves and their peers. Patients were given another blank face worksheet and were instructed to draw their self-portrait based on how they felt after reading positive comments from peers.  Education: LRT educated patients on ways they can increase their self-esteem.  Education Outcome: Acknowledges education/In group clarification offered  Clinical Observations/Feedback: Patient drew both self-portraits and wrote a positive trait about self and peers. Patient contributed to group discussion.  Jacquelynn Cree, LRT/CTRS 08/30/2016 1:56 PM

## 2016-08-30 NOTE — [facility] Group Notes (Signed)
[facility] LCSW Group Therapy 08/30/2016 9:30 AM  Type of Therapy: Group Therapy- Emotion Regulation  Participation Level: Active   Participation Quality:  Appropriate  Affect: Appropriate  Cognitive: Alert and Oriented   Insight:  Developing/Improving  Engagement in Therapy: Developing/Improving and Engaged   Modes of Intervention: Clarification, Confrontation, Discussion, Education, Exploration, Limit-setting, Orientation, Problem-solving, Rapport Building, Dance movement psychotherapist, Socialization and Support  Summary of Progress/Problems: The topic for group today was emotional regulation. This group focused on both positive and negative emotion identification and allowed group members to process ways to identify feelings, regulate negative emotions, and find healthy ways to manage internal/external emotions. Group members were asked to reflect on a time when their reaction to an emotion led to a negative outcome and explored how alternative responses using emotion regulation would have benefited them. Group members were also asked to discuss a time when emotion regulation was utilized when a negative emotion was experienced. Pt stated that he is "angry and depressed" today. Pt reports that he spoke with his girlfriend she is wanting him to return to [place] to help ger with groceries and money. Pt states that his girlfriend is a trigger for him and that anger is an emotion that he has a difficult time regulating. Pt mentioned that medicine helps him to calm down.   Jonathon Jordan, MSW, LCSWA 08/30/2016 10:19 AM

## 2016-08-31 MED ORDER — IBUPROFEN 600 MG PO TABS: 600.0000 mg | ORAL_TABLET | Freq: Four times a day (QID) | ORAL | 1 refills | Status: DC | PRN

## 2016-08-31 MED ORDER — CYCLOBENZAPRINE HCL 10 MG PO TABS: 10.0000 mg | ORAL_TABLET | Freq: Two times a day (BID) | ORAL | 1 refills | Status: DC | PRN

## 2016-08-31 MED ORDER — ARIPIPRAZOLE 5 MG PO TABS: 5.0000 mg | ORAL_TABLET | Freq: Every day | ORAL | 1 refills | Status: DC

## 2016-08-31 MED ORDER — TRAZODONE HCL 100 MG PO TABS: 100.0000 mg | ORAL_TABLET | Freq: Every evening | ORAL | 1 refills | Status: AC | PRN

## 2016-08-31 MED ORDER — TRAZODONE HCL 100 MG PO TABS: 100.0000 mg | ORAL_TABLET | Freq: Every evening | ORAL | 1 refills | Status: DC | PRN

## 2016-08-31 MED ORDER — SERTRALINE HCL 100 MG PO TABS
100.0000 mg | ORAL_TABLET | Freq: Every day | ORAL | Status: DC
  Administered 2016-09-01: 100 mg via ORAL
  Filled 2016-08-31: qty 1

## 2016-08-31 MED ORDER — AMLODIPINE BESYLATE 5 MG PO TABS: 5.0000 mg | ORAL_TABLET | Freq: Every day | ORAL | 1 refills | Status: DC

## 2016-08-31 MED ORDER — AMLODIPINE BESYLATE 5 MG PO TABS: 5.0000 mg | ORAL_TABLET | Freq: Every day | ORAL | 1 refills | Status: AC

## 2016-08-31 MED ORDER — IBUPROFEN 600 MG PO TABS: 600.0000 mg | ORAL_TABLET | Freq: Four times a day (QID) | ORAL | 1 refills | Status: AC | PRN

## 2016-08-31 MED ORDER — SERTRALINE HCL 100 MG PO TABS: 100.0000 mg | ORAL_TABLET | Freq: Every day | ORAL | 1 refills | Status: DC

## 2016-08-31 MED ORDER — CYCLOBENZAPRINE HCL 10 MG PO TABS: 10.0000 mg | ORAL_TABLET | Freq: Two times a day (BID) | ORAL | 1 refills | Status: AC | PRN

## 2016-08-31 MED ORDER — SERTRALINE HCL 100 MG PO TABS: 100.0000 mg | ORAL_TABLET | Freq: Every day | ORAL | 1 refills | Status: AC

## 2016-08-31 MED ORDER — ARIPIPRAZOLE 5 MG PO TABS: 5.0000 mg | ORAL_TABLET | Freq: Every day | ORAL | 1 refills | Status: AC

## 2016-08-31 NOTE — Progress Notes (Signed)
Recreation Therapy Notes  Date: 04.19.18 Time: 1:00 pm Location: Craft Room  Group Topic: Leisure Education  Goal Area(s) Addresses:  Patient will identify activity for each letter of the alphabet. Patient will verbalize ability to integrate positive leisure into life post d/c. Patient will verbalize ability to use leisure as a Associate Professor.  Behavioral Response: Attentive, Interactive  Intervention: Leisure Alphabet  Activity: Patients were given a Leisure Information systems manager and were instructed to write healthy leisure activities for each letter of the alphabet.  Education: LRT educated patients on what they need to participate in leisure.  Education Outcome: Acknowledges education/In group clarification offered  Clinical Observations/Feedback: Patient wrote healthy leisure activities. Patient contributed to group discussion by stating some of his healthy leisure activities.  Jacquelynn Cree, LRT/CTRS 08/31/2016 1:58 PM

## 2016-08-31 NOTE — Progress Notes (Signed)
[facility] MD Progress Note  08/31/2016 1:39 PM Ralph Anderson  MRN:  [id number]  Subjective:   08/28/2016. Ralph Anderson still feels depressed and vaguely suicidal. Complains of pain and symptoms of alcohol withdrawal. He was on CIWA but will be switched to Librium taper. Vital signs are stable.   08/29/2016. Ralph Anderson woke up today feeling suicidal. He complains of symptoms of withdrawal and has been sweating heavily during our conversation. He is on Librium taper. His vital signs are stable but we may need to increase the dose of Librium. His mood is depressed with anxious affect. His grooming is questionable. We tried to engage him in a conversation about discharge planning but the patient seems confused or undecided. He refuses residential substance abuse treatment program participation or [place] and believes that he can get a job and save up some money while being homeless. He accepts medication and tolerates them well. He complains of hand pain from a pinched nerve. He wears a brace.  08/30/2016. Ralph Anderson met with treatment team today. He again has vague suicidal thoughts and overall feels depressed and very anxious. Apparently in addition to joblessness and homelessness he violated a restraining order last month and possibly will go to jail. He accepts medications and tolerates them well. Vital signs are stable and he denies symptoms of alcohol withdrawal with increased dose of lithium. Sleep and appetite are fair. He participates in programming.  08/31/2016. Ralph Anderson still feels weak, depressed, and vaguely suicidal. He accepts medications and tolerates them well. I believe that at this point we either have to consider her suicidal ideation chronic or thinking of secondary gain as he has court date tomorrow. The patient was informed that he will be discharged tomorrow. He will go to a friend's house, try to find a day job and save some money. He has not been interested in substance abuse treatment at  all.  Per nursing: A&Ox3, c/o 6/10 pain (Aching & Discomfort) from right hand and back, Ibuprofen 600 mg and Flexeril 10 mg given with partial relief of pain; denied SI/HI/AVH.  Principal Problem: Severe episode of recurrent major depressive disorder, without psychotic features ([place]) Diagnosis:   Patient Active Problem List   Diagnosis Date Noted  . Tobacco use disorder [F17.200] 08/28/2016  . Adjustment disorder with mixed anxiety and depressed mood [F43.23] 08/27/2016  . HTN (hypertension) [I10] 08/27/2016  . Major depression, recurrent ([place]) [F33.9] 06/03/2016  . Chronic pain of lower extremity, bilateral [M79.604, M79.605, G89.29]   . Suicidal ideations [R45.851]   . Alcohol dependence with alcohol-induced mood disorder ([place]) [F10.24] 04/30/2016  . Severe episode of recurrent major depressive disorder, without psychotic features ([place]) [F33.2] 04/25/2016   Total Time spent with patient: 20 minutes  Past Psychiatric History: depression, alcoholism.  Past Medical History:  Past Medical History:  Diagnosis Date  . Anxiety   . Anxiety   . Depression   . Depression   . Hypertension    History reviewed. No pertinent surgical history. Family History: History reviewed. No pertinent family history. Family Psychiatric  History: see H&P. Social History:  History  Alcohol Use  . 3.6 - 7.2 oz/week  . 6 - 12 Cans of beer per week    Comment: He reports weekly use     History  Drug Use No    Comment: Pt stated "it's been 10 yrs since I smoked marijuana"    Social History   Social History  . Marital status: Single    Spouse  name: N/A  . Number of children: N/A  . Years of education: N/A   Social History Main Topics  . Smoking status: Current Every Day Smoker    Packs/day: 1.00    Types: Cigarettes  . Smokeless tobacco: Never Used  . Alcohol use 3.6 - 7.2 oz/week    6 - 12 Cans of beer per week     Comment: He reports weekly use  . Drug use: No     Comment: Pt stated  "it's been 10 yrs since I smoked marijuana"  . Sexual activity: Yes    Birth control/ protection: None   Other Topics Concern  . None   Social History Narrative  . None   Additional Social History:  Specify valuables returned: pant 2 socks  boot  hat  belt  cigrette 27 cent 1 dime 1 nickle 12 pennies  chips  tote bag blue pen  Pain Medications:  (none) Prescriptions:  (pt off meds for 3 months) Over the Counter:  (no) History of alcohol / drug use?: Yes Negative Consequences of Use: Financial, Legal, Personal relationships Name of Substance 1: ETOH 1 - Age of First Use:  (teens)                  Sleep: Fair  Appetite:  Fair  Current Medications: Current Facility-Administered Medications  Medication Dose Route Frequency Provider Last Rate Last Dose  . acetaminophen (TYLENOL) tablet 650 mg  650 mg Oral Q6H PRN Chauncey Mann, MD      . alum & mag hydroxide-simeth (MAALOX/MYLANTA) 200-200-20 MG/5ML suspension 30 mL  30 mL Oral Q4H PRN Chauncey Mann, MD      . amLODipine (NORVASC) tablet 5 mg  5 mg Oral Daily Chauncey Mann, MD   5 mg at 08/31/16 7035  . ARIPiprazole (ABILIFY) tablet 5 mg  5 mg Oral Daily Clovis Fredrickson, MD   5 mg at 08/31/16 0838  . cyclobenzaprine (FLEXERIL) tablet 10 mg  10 mg Oral BID PRN Chauncey Mann, MD   10 mg at 08/31/16 0841  . ibuprofen (ADVIL,MOTRIN) tablet 600 mg  600 mg Oral Q6H PRN Chauncey Mann, MD   600 mg at 08/31/16 0839  . magnesium hydroxide (MILK OF MAGNESIA) suspension 30 mL  30 mL Oral Daily PRN Chauncey Mann, MD      . nicotine (NICODERM CQ - dosed in mg/24 hours) patch 14 mg  14 mg Transdermal Daily Chauncey Mann, MD   14 mg at 08/31/16 0800  . [START ON 09/01/2016] sertraline (ZOLOFT) tablet 100 mg  100 mg Oral Daily Sudeep Scheibel B Contina Strain, MD      . traZODone (DESYREL) tablet 100 mg  100 mg Oral QHS PRN Chauncey Mann, MD   100 mg at 08/28/16 2204    Lab Results:  No results found for this or any previous visit (from the past 25  hour(s)).  Blood Alcohol level:  Lab Results  Component Value Date   ETH 53 (H) 08/25/2016   ETH 99 (H) 00/93/8182    Metabolic Disorder Labs: No results found for: HGBA1C, MPG No results found for: PROLACTIN No results found for: CHOL, TRIG, HDL, CHOLHDL, VLDL, LDLCALC  Physical Findings: AIMS: Facial and Oral Movements Muscles of Facial Expression: None, normal Lips and Perioral Area: None, normal Jaw: None, normal Tongue: None, normal,Extremity Movements Upper (arms, wrists, hands, fingers): None, normal Lower (legs, knees, ankles, toes): None, normal, Trunk Movements Neck, shoulders, hips: None,  normal, Overall Severity Severity of abnormal movements (highest score from questions above): None, normal Incapacitation due to abnormal movements: None, normal Patient's awareness of abnormal movements (rate only patient's report): No Awareness, Dental Status Current problems with teeth and/or dentures?: No Does patient usually wear dentures?: No  CIWA:  CIWA-Ar Total: 0 COWS:  COWS Total Score: 1  Musculoskeletal: Strength & Muscle Tone: within normal limits Gait & Station: normal Patient leans: N/A  Psychiatric Specialty Exam: Physical Exam  Nursing note and vitals reviewed. Psychiatric: His speech is normal and behavior is normal. His mood appears anxious. Cognition and memory are normal. He expresses impulsivity. He exhibits a depressed mood. He expresses suicidal ideation. He expresses suicidal plans.    Review of Systems  Psychiatric/Behavioral: Positive for depression, substance abuse and suicidal ideas.  All other systems reviewed and are negative.   Blood pressure 135/76, pulse 77, temperature 97.8 F (36.6 C), temperature source Oral, resp. rate 18, height _0  (1.676 m), weight 75.1 kg (165 lb 8 oz), SpO2 100 %.Body mass index is 26.71 kg/m.  General Appearance: Fairly Groomed  Eye Contact:  Good  Speech:  Clear and Coherent  Volume:  Normal  Mood:   Anxious and Dysphoric  Affect:  Congruent  Thought Process:  Goal Directed and Descriptions of Associations: Intact  Orientation:  Full (Time, Place, and Person)  Thought Content:  WDL  Suicidal Thoughts:  Yes.  without intent/plan  Homicidal Thoughts:  No  Memory:  Immediate;   Fair Recent;   Fair Remote;   Fair  Judgement:  Impaired  Insight:  Lacking  Psychomotor Activity:  Normal  Concentration:  Concentration: Fair and Attention Span: Fair  Recall:  [employer] of Knowledge:  Fair  Language:  Fair  Akathisia:  No  Handed:  Right  AIMS (if indicated):     Assets:  Communication Skills Desire for Improvement Physical Health Resilience Social Support  ADL's:  Intact  Cognition:  WNL  Sleep:  Number of Hours: 6.45     Treatment Plan Summary: Daily contact with patient to assess and evaluate symptoms and progress in treatment and Medication management   Ralph Anderson Is a 46 year old male with a history of depression and alcoholism admitted for suicidal ideation in the context of drinking and relationship problems.  1. Suicidal ideation. The patient is able to contract for safety in the hospital.  2. Mood. He was started on Zoloft for depression. I will add Abilify for augmentation.    3. Alcohol abuse. He was started on CIWA protocol and transitioned to Librium taper. I will increase Librium dose to 25 mg qid. Vital signs are stable.  4. Substance abuse treatment. The patient minimizes his problems and declines residential treatment. He is not interested in pharmacotherapy for alcoholism.  5. Smoking. Nicotine patch is available.  6. Hypertension. He is on Norvasc.  7. Chronic pain. He is on Flexeril and ibuprofen.  8. Insomnia. Trazodone is available.  9. Disposition. He will be discharged to the homeless shelter. He will follow up with [facility] in [place].  Orson Slick, MD 08/31/2016, 1:39 PM

## 2016-08-31 NOTE — [facility] Group Notes (Signed)
[facility] LCSW Group Therapy Note  Date/Time: 08/31/16, 0930  Type of Therapy/Topic:  Group Therapy:  Balance in Life  Participation Level:  minimal  Description of Group:    This group will address the concept of balance and how it feels and looks when one is unbalanced. Patients will be encouraged to process areas in their lives that are out of balance, and identify reasons for remaining unbalanced. Facilitators will guide patients utilizing problem- solving interventions to address and correct the stressor making their life unbalanced. Understanding and applying boundaries will be explored and addressed for obtaining  and maintaining a balanced life. Patients will be encouraged to explore ways to assertively make their unbalanced needs known to significant others in their lives, using other group members and facilitator for support and feedback.  Therapeutic Goals: 1. Patient will identify two or more emotions or situations they have that consume much of in their lives. 2. Patient will identify signs/triggers that life has become out of balance:  3. Patient will identify two ways to set boundaries in order to achieve balance in their lives:  4. Patient will demonstrate ability to communicate their needs through discussion and/or role plays  Summary of Patient Progress: Pt was attentive during group but did not participate or contribute to the discussion.          Therapeutic Modalities:   Cognitive Behavioral Therapy Solution-Focused Therapy Assertiveness Training  Daleen Squ[place]b, [place]

## 2016-08-31 NOTE — Progress Notes (Signed)
Patient ID: Ralph Anderson, male   DOB: 02/12/1971, 46 y.o.   MRN: [id number] Pleasant on approach, mood and affect appropriate, denied any SI/SIB/HI/AVH, "I am going to be discharged tomorrow, going to [facility], stay with a friend, stay out of trouble, get me a job, save me up some money, get me an apartment or a house...." C/O 7/10 Pain (right hand and lower back), Ibuprofen 600 mg, Flexeril 10 mg and Trazodone 100 mg given, PRNs, per patient's request.

## 2016-08-31 NOTE — Plan of Care (Signed)
Problem: [facility] Participation in Recreation Therapeutic Interventions Goal: STG-Patient will identify at least five coping skills for ** STG: Coping Skills - Within 4 treatment sessions, patient will verbalize at least 5 coping skills for substance abuse in each of 2 treatment sessions to decrease substance abuse.  Outcome: Progressing Treatment Session 1; Completed 1 out of 1: At approximately 8:45 am, LRT met with patient in consultation room. Patient verbalize 5 coping skills for substance abuse. LRT educated patient on leisure and why it is important to implement it into his schedule. LRT educated and provided patient with blank schedules to help him plan his day and try to avoid using substances. LRT educated patient on healthy support systems.  Leonette Monarch, LRT/CTRS 04.19.18 4:16 pm Goal: STG-Other Recreation Therapy Goal (Specify) STG: Stress Management - Within 4 treatment sessions, patient will verbalize understanding of the stress management techniques in each of 2 treatment sessions to increase stress management skills.  Outcome: Progressing Treatment Session 1; Completed 1 out of 2: At approximately 8:45 am, LRT met with patient in consultation room. LRT educated and provided patient with handouts on stress management techniques. Patient verbalized understanding. LRT encouraged patient to read over and practice the stress management techniques.  Leonette Monarch, LRT/CTRS 04.19.18 4:17 pm

## 2016-08-31 NOTE — Progress Notes (Signed)
Pt denies SI, HI, AVH. Med and group compliant. Out in milieu. Ibuprofen and flexeril given for pain with good relief. Pt to discharge on tomorrow. Encouragement and support offered. Safety checks maintained. Pt receptive and remains safe on unit with q 15 min checks.

## 2016-08-31 NOTE — Plan of Care (Signed)
Problem: Activity: Goal: Sleeping patterns will improve Outcome: Progressing Patient slept for Estimated Hours of 6.45; every 15 minutes safety round maintained, no injury or falls during this shift.    

## 2016-08-31 NOTE — Progress Notes (Signed)
Patient ID: Ralph Anderson, male   DOB: 1971/04/09, 46 y.o.   MRN: [id number] A&Ox3, c/o 6/10 pain (Aching & Discomfort) from right hand and back, Ibuprofen 600 mg and Flexeril 10 mg given with partial relief of pain; denied SI/HI/AVH.

## 2016-09-01 DIAGNOSIS — F10939 Alcohol use, unspecified with withdrawal, unspecified: Secondary | ICD-10-CM | POA: Diagnosis present

## 2016-09-01 DIAGNOSIS — F10239 Alcohol dependence with withdrawal, unspecified: Secondary | ICD-10-CM | POA: Diagnosis present

## 2016-09-01 NOTE — Progress Notes (Signed)
  [facility] Adult Case Management Discharge Plan : LATE ENTRY FOR CSW K GRANT  Will you be returning to the same living situation after discharge:  Yes,  home At discharge, do you have transportation home?: Yes,  family Do you have the ability to pay for your medications: Yes,  referred to provider who can assist w medication access if needed  Release of information consent forms completed and in the chart;  Patient's signature needed at discharge.  Patient to Follow up at: Follow-up [facility] Follow up on 09/07/2016.   Specialty:  Professional Counselor Why:  Thursday at 6PM with Jeanella Craze information: [facility] [place] [address] [place] [address] [phone]           Next level of care provider has access to [facility] Link:no  Safety Planning and Suicide Prevention discussed: Yes,  patient declined collateral contact, suicide prevention and safety planning reviewed w patient  Have you used any form of tobacco in the last 30 days? (Cigarettes, Smokeless Tobacco, Cigars, and/or Pipes): Yes  Has patient been referred to the Quitline?: Patient refused referral  Patient has been referred for addiction treatment: Yes  Sallee Lange 09/01/2016, 5:04 PM

## 2016-09-01 NOTE — [facility] Suicide Risk Assessment (Signed)
[facility] Discharge Suicide Risk Assessment   Principal Problem: Severe episode of recurrent major depressive disorder, without psychotic features [facility]) Discharge Diagnoses:  Patient Active Problem List   Diagnosis Date Noted  . Tobacco use disorder [F17.200] 08/28/2016  . Adjustment disorder with mixed anxiety and depressed mood [F43.23] 08/27/2016  . HTN (hypertension) [I10] 08/27/2016  . Major depression, recurrent ([facility]) [F33.9] 06/03/2016  . Chronic pain of lower extremity, bilateral [M79.604, M79.605, G89.29]   . Suicidal ideations [R45.851]   . Alcohol dependence with alcohol-induced mood disorder ([facility]) [F10.24] 04/30/2016  . Severe episode of recurrent major depressive disorder, without psychotic features ([facility]) [F33.2] 04/25/2016    Total Time spent with patient: 30 minutes  Musculoskeletal: Strength & Muscle Tone: within normal limits Gait & Station: normal Patient leans: N/A  Psychiatric Specialty Exam: Review of Systems  Psychiatric/Behavioral: Positive for substance abuse.  All other systems reviewed and are negative.   Blood pressure 126/70, pulse 84, temperature 97.7 F (36.5 C), temperature source Oral, resp. rate 18, height  (1.676 m), weight 75.1 kg (165 lb 8 oz), SpO2 100 %.Body mass index is 26.71 kg/m.  General Appearance: Casual  Eye Contact::  Good  Speech:  Clear and Coherent409  Volume:  Normal  Mood:  Euthymic  Affect:  Appropriate  Thought Process:  Goal Directed and Descriptions of Associations: Intact  Orientation:  Full (Time, Place, and Person)  Thought Content:  WDL  Suicidal Thoughts:  No  Homicidal Thoughts:  No  Memory:  Immediate;   Fair Recent;   Fair Remote;   Fair  Judgement:  Impaired  Insight:  Shallow  Psychomotor Activity:  Normal  Concentration:  Fair  Recall:  [employer] of Knowledge:Fair  Language: Fair  Akathisia:  No  Handed:  Right  AIMS (if indicated):     Assets:  Communication Skills Desire for  Improvement Housing Resilience Social Support  Sleep:  Number of Hours: 6.15  Cognition: WNL  ADL's:  Intact   Mental Status Per Nursing Assessment::   On Admission:     Demographic Factors:  Male, Caucasian, Low socioeconomic status and Unemployed  Loss Factors: Decrease in vocational status, Decline in physical health and Financial problems/change in socioeconomic status  Historical Factors: Prior suicide attempts, Family history of mental illness or substance abuse and Impulsivity  Risk Reduction Factors:   Sense of responsibility to family and Positive social support  Continued Clinical Symptoms:  Depression:   Comorbid alcohol abuse/dependence Impulsivity Alcohol/Substance Abuse/Dependencies  Cognitive Features That Contribute To Risk:  None    Suicide Risk:  Minimal: No identifiable suicidal ideation.  Patients presenting with no risk factors but with morbid ruminations; may be classified as minimal risk based on the severity of the depressive symptoms  Follow-up Information    [facility] [place] Follow up on 09/07/2016.   Specialty:  Professional Counselor Why:  Thursday at [facility] with Jeanella Craze information: [facility] of the [place] [address] [place] [address] [phone]           Plan Of Care/Follow-up recommendations:  Activity:  As tolerated. Diet:  Low sodium heart healthy. Other:  Keep follow-up appointments.  Kristine Linea, MD 09/01/2016, 8:39 AM

## 2016-09-01 NOTE — Plan of Care (Signed)
Problem: Activity: Goal: Sleeping patterns will improve Outcome: Progressing Patient slept for Estimated Hours of 6.15; every 15 minutes safety round maintained, no injury or falls during this shift.    

## 2016-09-01 NOTE — Progress Notes (Signed)
Recreation Therapy Notes  INPATIENT RECREATION TR PLAN  Patient Details Name: Ashawn Rinehart MRN: [id number] DOB: 05-23-1970 Today's Date: 09/01/2016  Rec Therapy Plan Is patient appropriate for Therapeutic Recreation?: Yes Treatment times per week: At least once a week TR Treatment/Interventions: 1:1 session, Group participation (Comment) (Appropriate participation in daily recreational therapy tx)  Discharge Criteria Pt will be discharged from therapy if:: Treatment goals are met, Discharged Treatment plan/goals/alternatives discussed and agreed upon by:: Patient/family  Discharge Summary Short term goals set: See Care Plan Short term goals met: Complete Progress toward goals comments: One-to-one attended Which groups?: Communication, Self-esteem, Leisure education One-to-one attended: Stress management, coping skills Reason goals not met: N/A Therapeutic equipment acquired: None Reason patient discharged from therapy: Discharge from hospital Pt/family agrees with progress & goals achieved: Yes Date patient discharged from therapy: 09/01/16   Leonette Monarch, LRT/CTRS 09/01/2016, 2:36 PM

## 2016-09-01 NOTE — [facility] Group Notes (Signed)
[facility] Group Notes:  (Nursing/MHT/Case Management/Adjunct)  Date:  09/01/2016  Time:  5:33 AM  Type of Therapy:  Psychoeducational Skills  Participation Level:  Active  Participation Quality:  Appropriate and Attentive  Affect:  Appropriate  Cognitive:  Appropriate  Insight:  Appropriate and Good  Engagement in Group:  Engaged  Modes of Intervention:  Discussion, Socialization and Support  Summary of Progress/Problems:  Chancy Milroy 09/01/2016, 5:33 AM

## 2016-09-01 NOTE — Plan of Care (Signed)
Problem: [facility] Participation in Recreation Therapeutic Interventions Goal: STG-Patient will identify at least five coping skills for ** STG: Coping Skills - Within 4 treatment sessions, patient will verbalize at least 5 coping skills for substance abuse in each of 2 treatment sessions to decrease substance abuse.  Outcome: Completed/Met Date Met: 09/01/16 Treatment Session 2; Completed 2 out of 2: At approximately 8:50 am, LRT met with patient in craft room. Patient verbalized 5 coping skills for substance abuse. LRT encouraged patient to use his coping skills instead of turning to substances.  Leonette Monarch, LRT/CTRS 04.20.18 2:33 pm Goal: STG-Other Recreation Therapy Goal (Specify) STG: Stress Management - Within 4 treatment sessions, patient will verbalize understanding of the stress management techniques in each of 2 treatment sessions to increase stress management skills.  Outcome: Completed/Met Date Met: 09/01/16 Treatment Session 2; Completed 2 out of 2: At approximately 8:50 am, LRT met with patient in craft room. Patient reported he read over the stress management techniques. Patient verbalized understanding. LRT encouraged patient to practice the stress management techniques.  Leonette Monarch, LRT/CTRS 04.20.18 2:35 pm
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
## 2016-09-01 NOTE — Progress Notes (Signed)
Patient discharged home. DC instructions provided and explained. Medications reviewed. Rx given. All questions answered. Denies SI, HI, AVH. Pt stable at discharge. Belongings returned. 7 day supply given, as well as AVS, transition and discharge summary.

## 2017-02-07 ENCOUNTER — Emergency Department (HOSPITAL_COMMUNITY)
Admission: EM | Admit: 2017-02-07 | Discharge: 2017-02-07 | Disposition: A | Discharge status: Home / Self Care | Attending: Emergency Medicine | Admitting: Emergency Medicine

## 2017-02-07 DIAGNOSIS — S0990XA Unspecified injury of head, initial encounter: Secondary | ICD-10-CM | POA: Diagnosis present

## 2017-02-07 DIAGNOSIS — Y998 Other external cause status: Secondary | ICD-10-CM | POA: Insufficient documentation

## 2017-02-07 DIAGNOSIS — Y939 Activity, unspecified: Secondary | ICD-10-CM | POA: Diagnosis not present

## 2017-02-07 DIAGNOSIS — S0101XA Laceration without foreign body of scalp, initial encounter: Secondary | ICD-10-CM

## 2017-02-07 DIAGNOSIS — W01198A Fall on same level from slipping, tripping and stumbling with subsequent striking against other object, initial encounter: Secondary | ICD-10-CM | POA: Insufficient documentation

## 2017-02-07 DIAGNOSIS — Y929 Unspecified place or not applicable: Secondary | ICD-10-CM | POA: Insufficient documentation

## 2017-02-07 MED ORDER — TETANUS-DIPHTH-ACELL PERTUSSIS 5-2.5-18.5 LF-MCG/0.5 IM SUSP
0.5000 mL | Freq: Once | INTRAMUSCULAR | Status: DC
  Filled 2017-02-07: qty 0.5

## 2017-02-07 MED ORDER — LIDOCAINE-EPINEPHRINE (PF) 2 %-1:200000 IJ SOLN
20.0000 mL | Freq: Once | INTRAMUSCULAR | Status: AC
  Administered 2017-02-07: 20 mL
  Filled 2017-02-07: qty 20

## 2017-02-07 NOTE — ED Provider Notes (Signed)
[facility]-EMERGENCY DEPT Provider Note   CSN: [id number] Arrival date & time: 02/07/17  0241     History   Chief Complaint Chief Complaint  Patient presents with  . Fall  . Laceration    HPI Ralph Anderson is a 46 y.o. male.  Patient presents to the emergency department with chief complaint of fall. He states that he was going to sit down on a bench, but this the bandage, and struck his head as he fell. He presents from prison. He denies any LOC. Denies any nausea, vomiting vision changes, numbness, weakness, tingling, or amnesia. He denies any other associated symptoms. There are no modifying factors. He has not taken anything for symptoms. Last tetanus shot unknown.   The history is provided by the patient. No language interpreter was used.    Past Medical History:  Diagnosis Date  . Anxiety   . Anxiety   . Depression   . Depression   . Hypertension     Patient Active Problem List   Diagnosis Date Noted  . Alcohol withdrawal (HCC) 09/01/2016  . Tobacco use disorder 08/28/2016  . Adjustment disorder with mixed anxiety and depressed mood 08/27/2016  . HTN (hypertension) 08/27/2016  . Major depression, recurrent (HCC) 06/03/2016  . Chronic pain of lower extremity, bilateral   . Suicidal ideations   . Alcohol dependence with alcohol-induced mood disorder (HCC) 04/30/2016  . Severe episode of recurrent major depressive disorder, without psychotic features (HCC) 04/25/2016    No past surgical history on file.     Home Medications    Prior to Admission medications   Medication Sig Start Date End Date Taking? Authorizing Provider  amLODipine (NORVASC) 5 MG tablet Take 1 tablet (5 mg total) by mouth daily. Patient not taking: Reported on 02/07/2017 09/01/16   Pucilowska, Braulio Conte B, MD  ARIPiprazole (ABILIFY) 5 MG tablet Take 1 tablet (5 mg total) by mouth daily. Patient not taking: Reported on 02/07/2017 09/01/16   Pucilowska, Ellin Goodie, MD  cyclobenzaprine (FLEXERIL) 10  MG tablet Take 1 tablet (10 mg total) by mouth 2 (two) times daily as needed for muscle spasms. Patient not taking: Reported on 02/07/2017 08/31/16   Pucilowska, Braulio Conte B, MD  ibuprofen (ADVIL,MOTRIN) 600 MG tablet Take 1 tablet (600 mg total) by mouth every 6 (six) hours as needed for mild pain. Patient not taking: Reported on 02/07/2017 08/31/16   Pucilowska, Braulio Conte B, MD  sertraline (ZOLOFT) 100 MG tablet Take 1 tablet (100 mg total) by mouth daily. 09/01/16   Pucilowska, Braulio Conte B, MD  traZODone (DESYREL) 100 MG tablet Take 1 tablet (100 mg total) by mouth at bedtime as needed for sleep. Patient not taking: Reported on 02/07/2017 08/31/16   Shari Prows, MD    Family History No family history on file.  Social History Social History  Substance Use Topics  . Smoking status: Current Every Day Smoker    Packs/day: 1.00    Types: Cigarettes  . Smokeless tobacco: Never Used  . Alcohol use 3.6 - 7.2 oz/week    6 - 12 Cans of beer per week     Comment: He reports weekly use     Allergies   Patient has no known allergies.   Review of Systems Review of Systems  All other systems reviewed and are negative.    Physical Exam Updated Vital Signs BP 121/85 (BP Location: Left Arm)   Pulse 92   Temp (!) 97.5 F (36.4 C) (Oral)   Resp 20  SpO2 98%   Physical Exam  Constitutional: He is oriented to person, place, and time. No distress.  HENT:  Head: Normocephalic and atraumatic.  7 cm linear laceration to the posterior scalp without underlying crepitus, bleeding is controlled, no foreign body  Eyes: Pupils are equal, round, and reactive to light. Conjunctivae and EOM are normal.  Neck: No tracheal deviation present.  Cardiovascular: Normal rate.   Pulmonary/Chest: Effort normal. No respiratory distress.  Abdominal: Soft.  Musculoskeletal: Normal range of motion.  Neurological: He is alert and oriented to person, place, and time.  Skin: Skin is warm and dry. He is not  diaphoretic.  Psychiatric: Judgment normal.  Nursing note and vitals reviewed.    ED Treatments / Results  Labs (all labs ordered are listed, but only abnormal results are displayed) Labs Reviewed - No data to display  EKG  EKG Interpretation None       Radiology No results found.  Procedures .Marland KitchenLaceration Repair Date/Time: 02/07/2017 3:56 AM Performed by: Roxy Horseman Authorized by: Roxy Horseman   Consent:    Consent obtained:  Verbal   Consent given by:  Patient   Risks discussed:  Pain, infection and poor wound healing   Alternatives discussed:  No treatment Anesthesia (see MAR for exact dosages):    Anesthesia method:  Local infiltration   Local anesthetic:  Lidocaine 1% WITH epi Laceration details:    Location:  Scalp   Scalp location:  Crown   Length (cm):  7 Repair type:    Repair type:  Simple Pre-procedure details:    Preparation:  Patient was prepped and draped in usual sterile fashion Exploration:    Hemostasis achieved with:  Direct pressure   Wound exploration: wound explored through full range of motion and entire depth of wound probed and visualized     Wound extent: no foreign bodies/material noted and no nerve damage noted     Contaminated: no   Treatment:    Area cleansed with:  Saline   Amount of cleaning:  Standard   Irrigation solution:  Sterile saline   Irrigation method:  Syringe   Visualized foreign bodies/material removed: no   Skin repair:    Repair method:  Sutures   Suture size:  4-0   Wound skin closure material used: vicryl plus.   Suture technique:  Simple interrupted   Number of sutures:  7 Approximation:    Approximation:  Close   Vermilion border: well-aligned   Post-procedure details:    Dressing:  Antibiotic ointment and adhesive bandage   Patient tolerance of procedure:  Tolerated well, no immediate complications   (including critical care time)  Medications Ordered in ED Medications  Tdap (BOOSTRIX)  injection 0.5 mL (not administered)  lidocaine-EPINEPHrine (XYLOCAINE W/EPI) 2 %-1:200000 (PF) injection 20 mL (not administered)     Initial Impression / Assessment and Plan / ED Course  I have reviewed the triage vital signs and the nursing notes.  Pertinent labs & imaging results that were available during my care of the patient were reviewed by me and considered in my medical decision making (see chart for details).     Patient with scalp laceration. Laceration repaired in the ED. No indication for CT head per [place] head CT rules. Patient is well-appearing. He is neurovascularly intact. Tetanus shot updated. Return precautions given.  Final Clinical Impressions(s) / ED Diagnoses   Final diagnoses:  Injury of head, initial encounter  Laceration of scalp, initial encounter    New Prescriptions New  Prescriptions   No medications on file     Roxy Horseman, Cordelia Poche 02/07/17 0403    Geoffery Lyons, MD 02/07/17 [phone]
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
## 2017-02-07 NOTE — Discharge Instructions (Signed)
Do not shower for 24 hours.  The wound needs to heal for at least a week before wearing a hat or doing anything that my compromise the wound healing.

## 2018-05-04 IMAGING — CT CT ABD-PELV W/ CM
2 of 11 series · 4 of 46 positions shown, 6 images · IV contrast (Iodine)
Comparison: None

CLINICAL DATA: Chest pain, right arm numbness, leading to the
right. Left lower quadrant tenderness.

EXAM:
CT ANGIOGRAPHY CHEST
CT ABDOMEN AND PELVIS WITH CONTRAST
TECHNIQUE: Multidetector CT imaging of the chest was performed using the
standard protocol during bolus administration of intravenous
contrast. Multiplanar CT image reconstructions and MIPs were
obtained to evaluate the vascular anatomy. Multidetector CT imaging
of the abdomen and pelvis was performed using the standard protocol
during bolus administration of intravenous contrast.
CONTRAST:  100 mL Isovue 370

[Series 602: coronals · coronal · 0.45mm/px · 3 of 141 slices shown, 4 images (1 of 2)]
[im 1/141  soft-tissue]
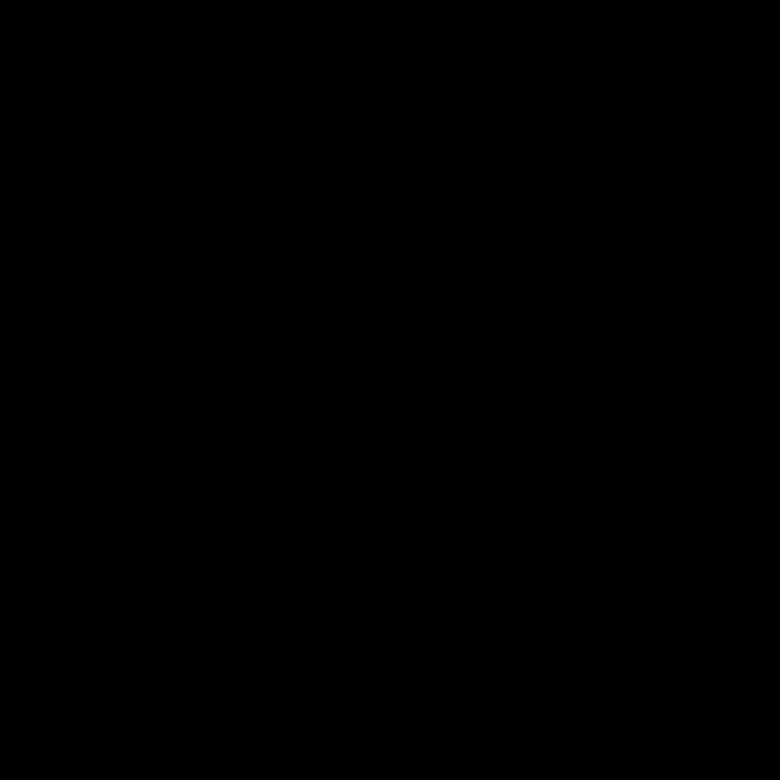
[im 1/141  bone]
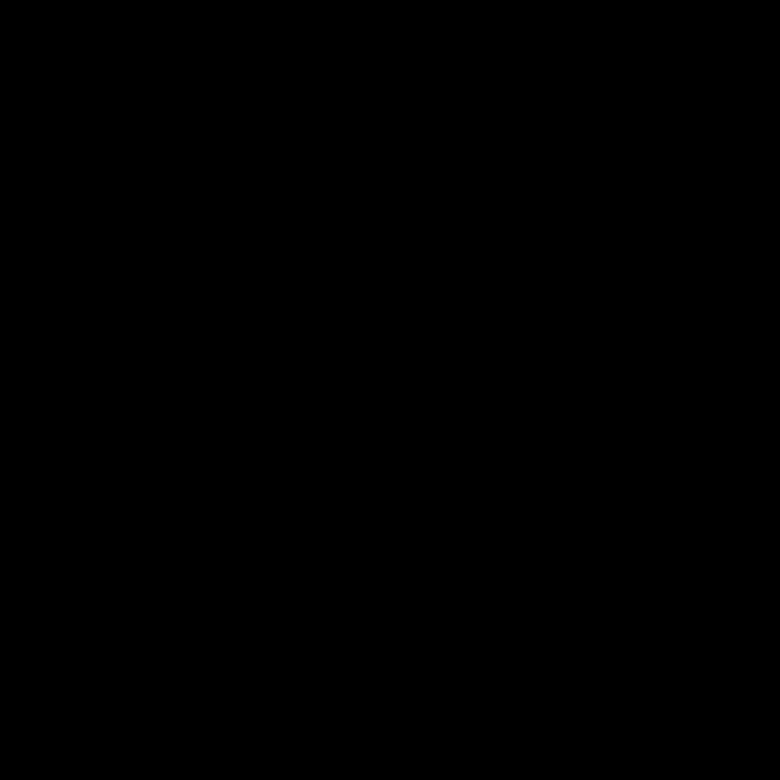
[im 71/141  soft-tissue]
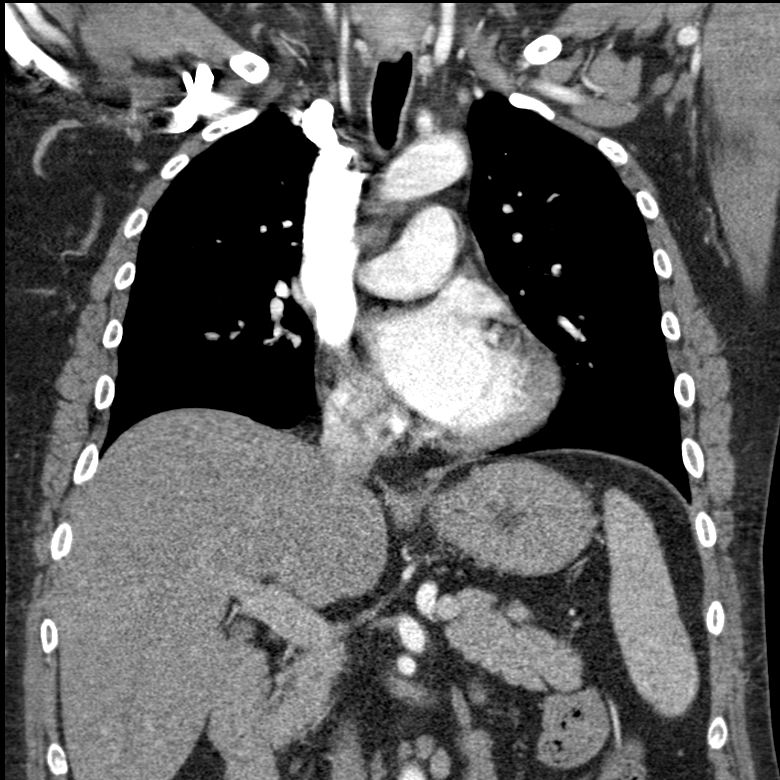
[im 141/141  soft-tissue]
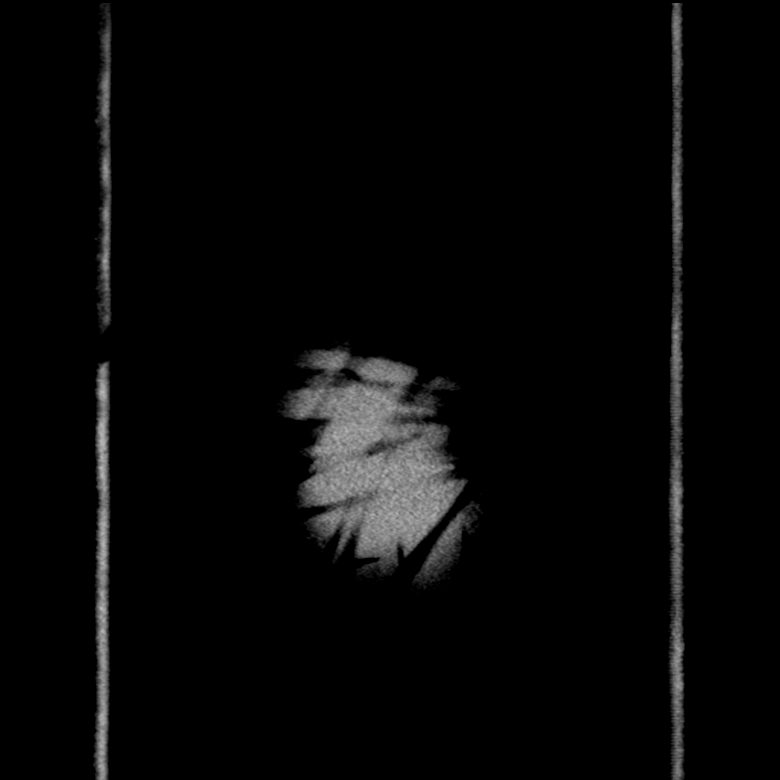

[Series 702: coronals · coronal · 0.45mm/px · 1 of 115 slices shown, 2 images (2 of 2)]
[im 58/115  soft-tissue]
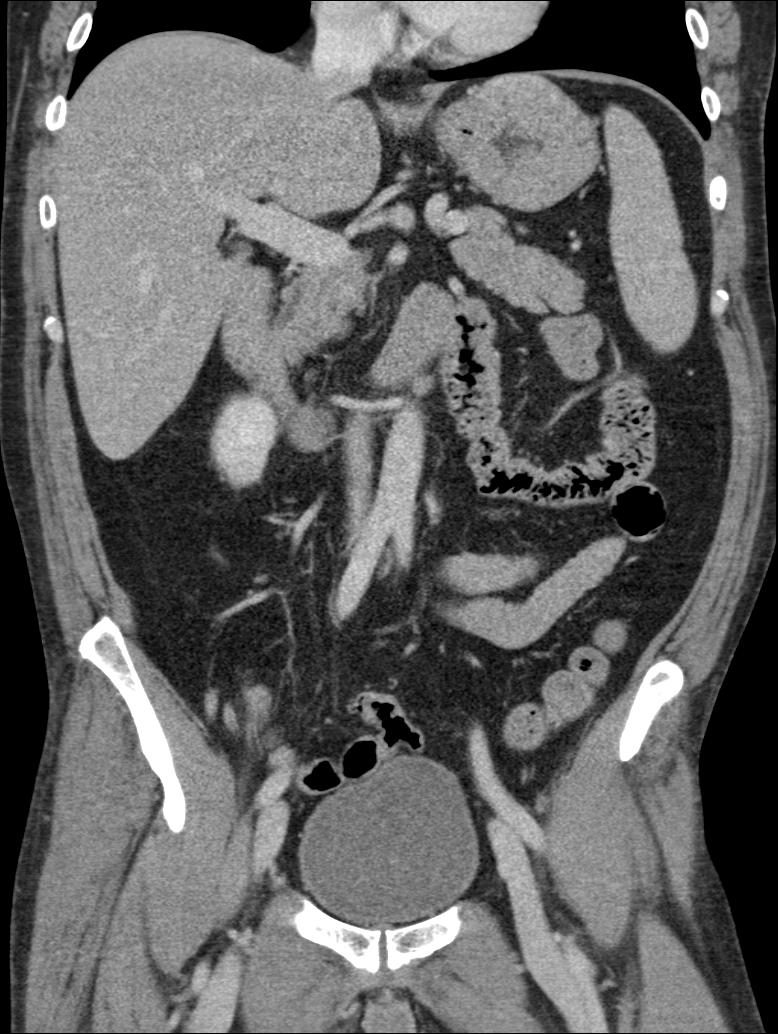
[im 58/115  bone]
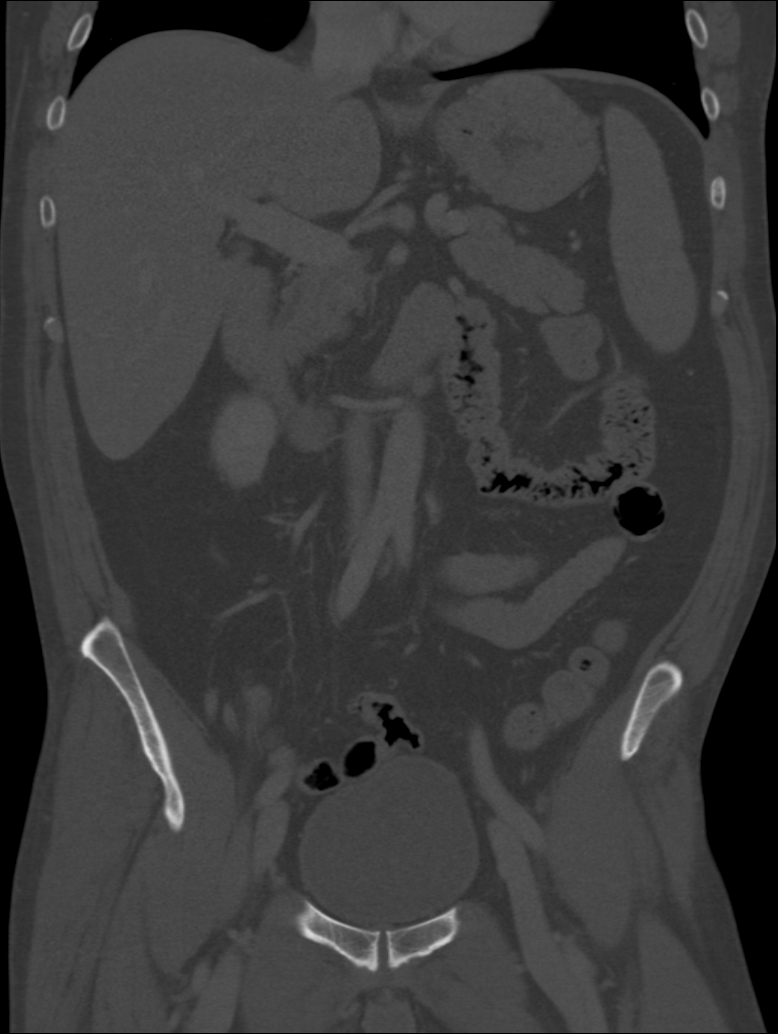

[4 of 46 positions shown; findings below may reference images not displayed]

FINDINGS: CTA CHEST FINDINGS

Cardiovascular: Satisfactory opacification of the pulmonary arteries
to the segmental level. No evidence of pulmonary embolism. Normal
heart size. No pericardial effusion. No thoracic aortic aneurysm. No
thoracic aortic dissection.

Mediastinum/Nodes: No enlarged mediastinal, hilar, or axillary lymph
nodes. Thyroid gland, trachea, and esophagus demonstrate no
significant findings.

Lungs/Pleura: Mild right basilar atelectasis. No focal
consolidation. No pleural effusion or pneumothorax.

Musculoskeletal: No chest wall abnormality. No acute or significant
osseous findings.

Review of the MIP images confirms the above findings.

CT ABDOMEN and PELVIS FINDINGS

Hepatobiliary: No focal liver abnormality is seen. No gallstones,
gallbladder wall thickening, or biliary dilatation.

Pancreas: Unremarkable. No pancreatic ductal dilatation or
surrounding inflammatory changes.

Spleen: Normal in size without focal abnormality.

Adrenals/Urinary Tract: Adrenal glands are unremarkable. Kidneys are
normal, without renal calculi, focal lesion, or hydronephrosis.
Bladder is unremarkable.

Stomach/Bowel: Stomach is within normal limits. Appendix appears
normal. No evidence of bowel wall thickening, distention, or
inflammatory changes. Mild rectal fecal impaction.

Vascular/Lymphatic: No significant vascular findings are present. No
enlarged abdominal or pelvic lymph nodes.

Reproductive: Prostate is unremarkable.

Other: No abdominal wall hernia or abnormality. No abdominopelvic
ascites.

Musculoskeletal: No acute osseous abnormality. No lytic or sclerotic
osseous lesion. Mild osteoarthritis of the left sacroiliac joint.
Degenerative disc disease with disc height loss at L5-S1. Bilateral
foraminal stenosis at L5-S1.

Review of the MIP images confirms the above findings.
IMPRESSION: 1. No evidence of pulmonary embolus.
2. No thoracic aortic dissection.
3. No thoracic aortic aneurysm.
4. No acute abdominal or pelvic pathology.
5. Rectal fecal impaction.

## 2018-05-04 IMAGING — CT CT ABD-PELV W/ CM
1 series · 1 of 1 positions shown · IV contrast (isovue)
Comparison: None

CLINICAL DATA: Chest pain, right arm numbness, leading to the
right. Left lower quadrant tenderness.

EXAM:
CT ANGIOGRAPHY CHEST
CT ABDOMEN AND PELVIS WITH CONTRAST
TECHNIQUE: Multidetector CT imaging of the chest was performed using the
standard protocol during bolus administration of intravenous
contrast. Multiplanar CT image reconstructions and MIPs were
obtained to evaluate the vascular anatomy. Multidetector CT imaging
of the abdomen and pelvis was performed using the standard protocol
during bolus administration of intravenous contrast.
CONTRAST:  100 mL Isovue 370

[Series 100: scout · coronal · 0.6mm · 0.98mm/px · 1 of 1 slices shown]
[im 1/1]
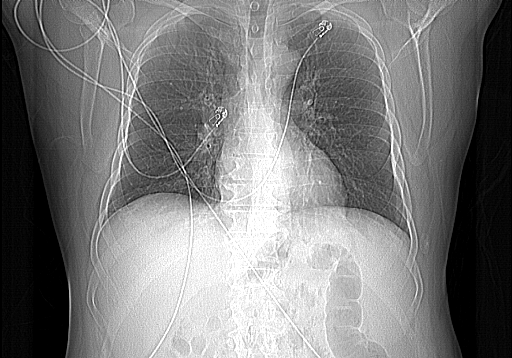

[1 of 1 positions shown; findings below may reference images not displayed]

FINDINGS: CTA CHEST FINDINGS

Cardiovascular: Satisfactory opacification of the pulmonary arteries
to the segmental level. No evidence of pulmonary embolism. Normal
heart size. No pericardial effusion. No thoracic aortic aneurysm. No
thoracic aortic dissection.

Mediastinum/Nodes: No enlarged mediastinal, hilar, or axillary lymph
nodes. Thyroid gland, trachea, and esophagus demonstrate no
significant findings.

Lungs/Pleura: Mild right basilar atelectasis. No focal
consolidation. No pleural effusion or pneumothorax.

Musculoskeletal: No chest wall abnormality. No acute or significant
osseous findings.

Review of the MIP images confirms the above findings.

CT ABDOMEN and PELVIS FINDINGS

Hepatobiliary: No focal liver abnormality is seen. No gallstones,
gallbladder wall thickening, or biliary dilatation.

Pancreas: Unremarkable. No pancreatic ductal dilatation or
surrounding inflammatory changes.

Spleen: Normal in size without focal abnormality.

Adrenals/Urinary Tract: Adrenal glands are unremarkable. Kidneys are
normal, without renal calculi, focal lesion, or hydronephrosis.
Bladder is unremarkable.

Stomach/Bowel: Stomach is within normal limits. Appendix appears
normal. No evidence of bowel wall thickening, distention, or
inflammatory changes. Mild rectal fecal impaction.

Vascular/Lymphatic: No significant vascular findings are present. No
enlarged abdominal or pelvic lymph nodes.

Reproductive: Prostate is unremarkable.

Other: No abdominal wall hernia or abnormality. No abdominopelvic
ascites.

Musculoskeletal: No acute osseous abnormality. No lytic or sclerotic
osseous lesion. Mild osteoarthritis of the left sacroiliac joint.
Degenerative disc disease with disc height loss at L5-S1. Bilateral
foraminal stenosis at L5-S1.

Review of the MIP images confirms the above findings.
IMPRESSION: 1. No evidence of pulmonary embolus.
2. No thoracic aortic dissection.
3. No thoracic aortic aneurysm.
4. No acute abdominal or pelvic pathology.
5. Rectal fecal impaction.
